# Patient Record
Sex: Female | Born: 1985
Health system: Southern US, Community
[De-identification: ages and names within clinical notes are randomized; demographics above are authoritative.]

## PROBLEM LIST (undated history)

## (undated) ENCOUNTER — Inpatient Hospital Stay: Payer: Self-pay

## (undated) DIAGNOSIS — K859 Acute pancreatitis without necrosis or infection, unspecified: Secondary | ICD-10-CM

## (undated) DIAGNOSIS — K429 Umbilical hernia without obstruction or gangrene: Secondary | ICD-10-CM

## (undated) DIAGNOSIS — M6208 Separation of muscle (nontraumatic), other site: Secondary | ICD-10-CM

## (undated) DIAGNOSIS — K824 Cholesterolosis of gallbladder: Secondary | ICD-10-CM

## (undated) HISTORY — DX: Umbilical hernia without obstruction or gangrene: K42.9

## (undated) HISTORY — DX: Cholesterolosis of gallbladder: K82.4

## (undated) HISTORY — DX: Acute pancreatitis without necrosis or infection, unspecified: K85.90

## (undated) HISTORY — DX: Separation of muscle (nontraumatic), other site: M62.08

## (undated) HISTORY — PX: WISDOM TOOTH EXTRACTION: SHX21

---

## 2005-12-31 ENCOUNTER — Ambulatory Visit: Payer: Self-pay | Admitting: Family Medicine

## 2006-01-08 ENCOUNTER — Ambulatory Visit: Payer: Self-pay | Admitting: Family Medicine

## 2007-06-17 IMAGING — CT CT ABDOMEN WO/W CM
2 of 4 series · 14 of 32 positions shown, 19 images · non-contrast
Comparison: none

REASON FOR EXAM: Abnormal ultrasound exam showing abnormality of left
kidney
COMMENTS:

PROCEDURE:     CT  - CT ABDOMEN STANDARD W/WO  - January 08, 2006  [DATE]
RESULT:     Triphasic CT scan of abdomen is performed with and without
contrast. [employer] 3D reconstructions are performed by the physician at the
Workstation.
Correlation is made to previous ultrasound dated 12/31/2005.

[Series 4: soft tissue with · axial · 0.57mm/px · z∈[-258,-98]mm · 7 of 28 slices shown, 12 images]
[im 4/28  soft-tissue]
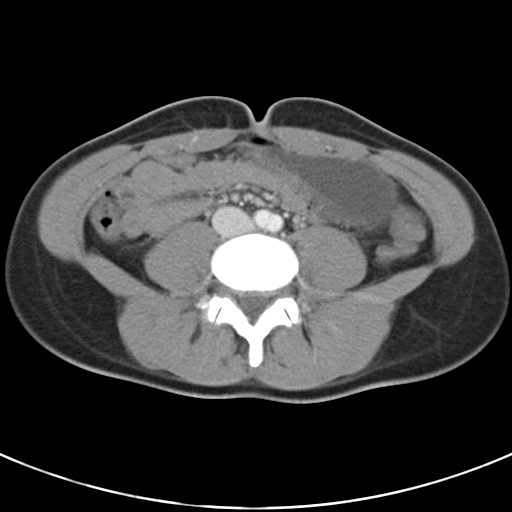
[im 4/28  bone]
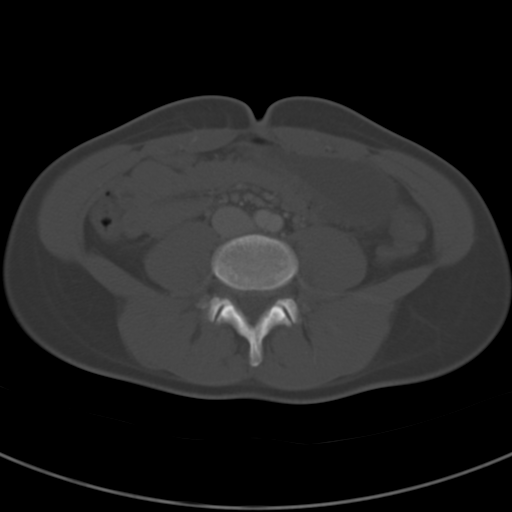
[im 7/28  soft-tissue]
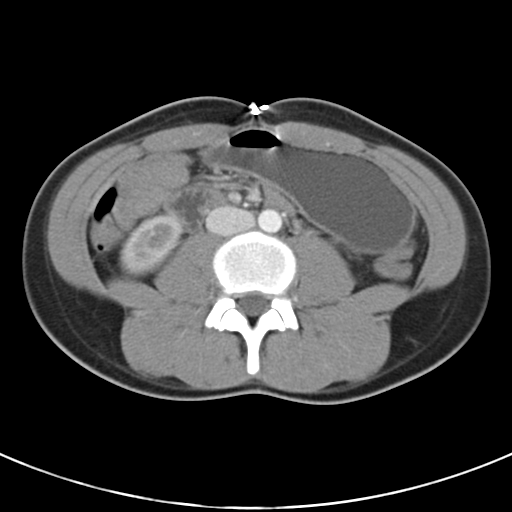
[im 11/28  soft-tissue]
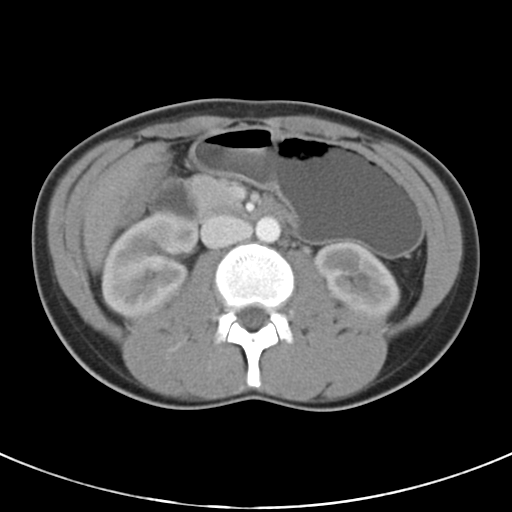
[im 14/28  soft-tissue]
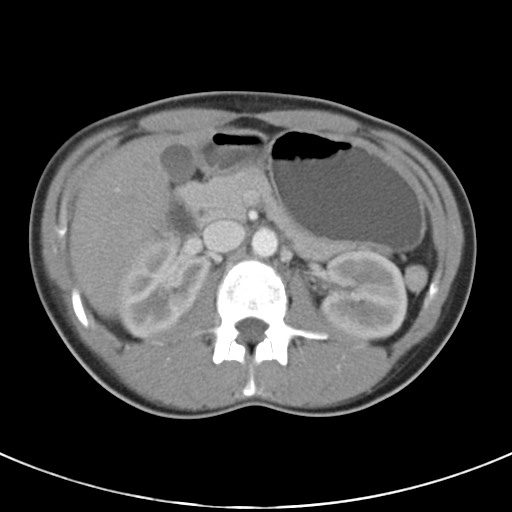
[im 14/28  lung]
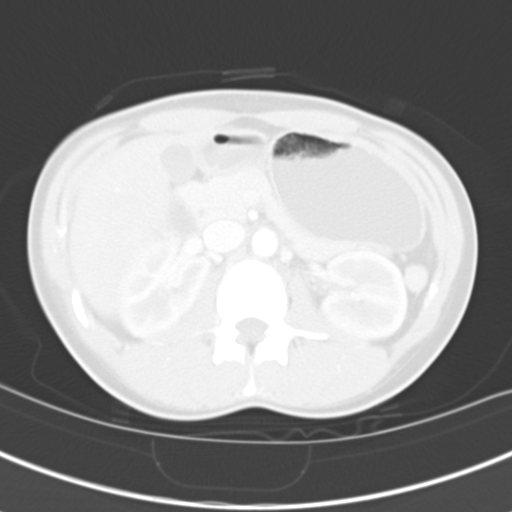
[im 17/28  soft-tissue]
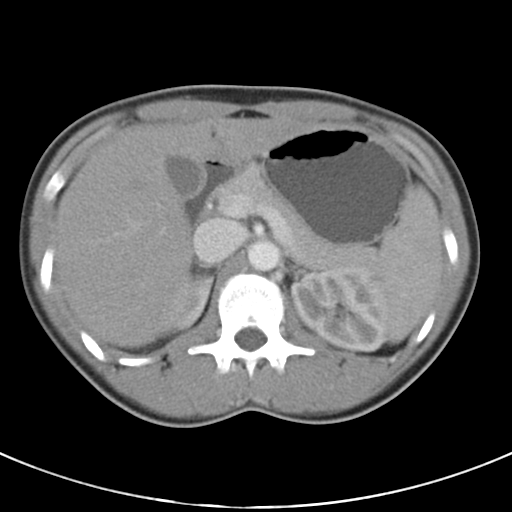
[im 17/28  lung]
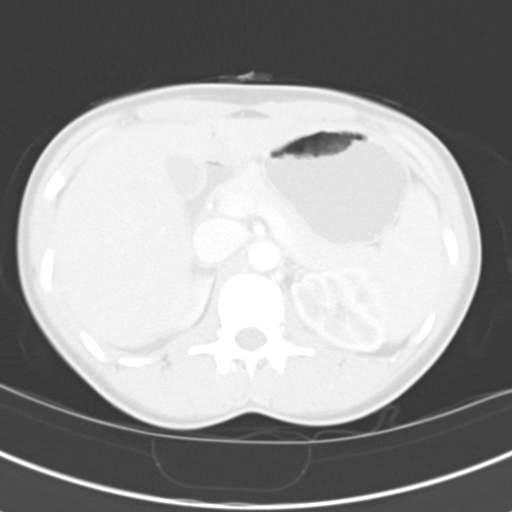
[im 21/28  soft-tissue]
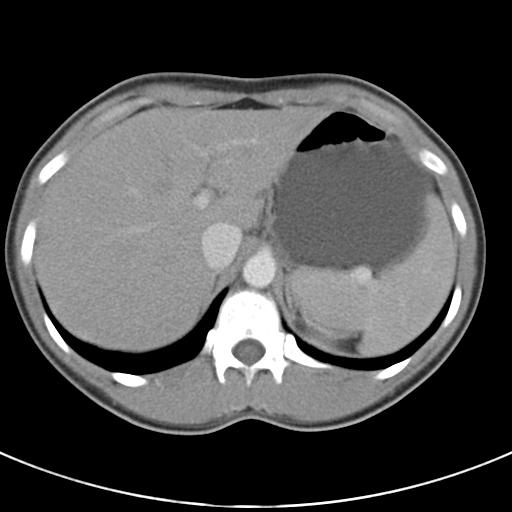
[im 21/28  lung]
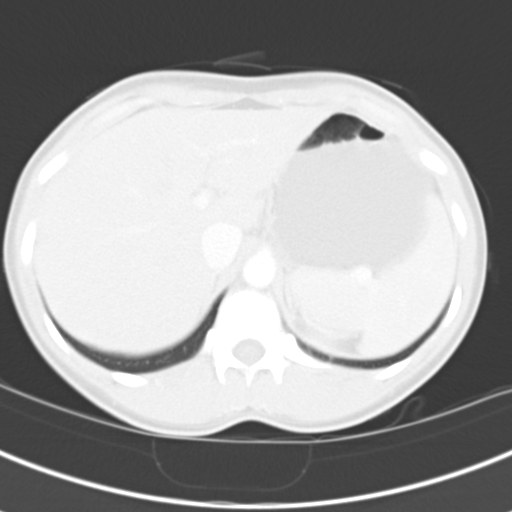
[im 24/28  soft-tissue]
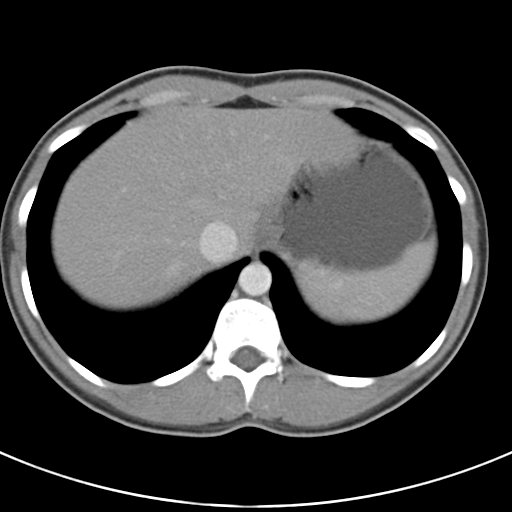
[im 24/28  lung]
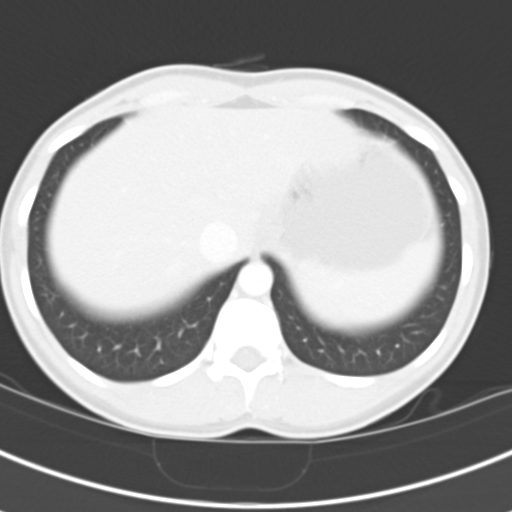

[Series 6: soft tissue delay · axial · delayed · 0.57mm/px · z∈[-258,-98]mm · 7 of 28 slices shown]
[im 4/28  soft-tissue]
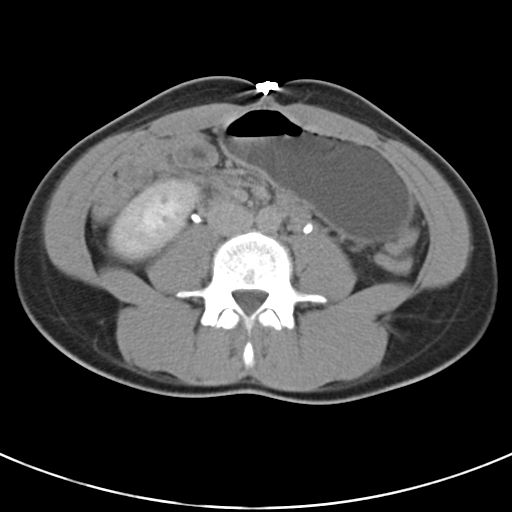
[im 7/28  soft-tissue]
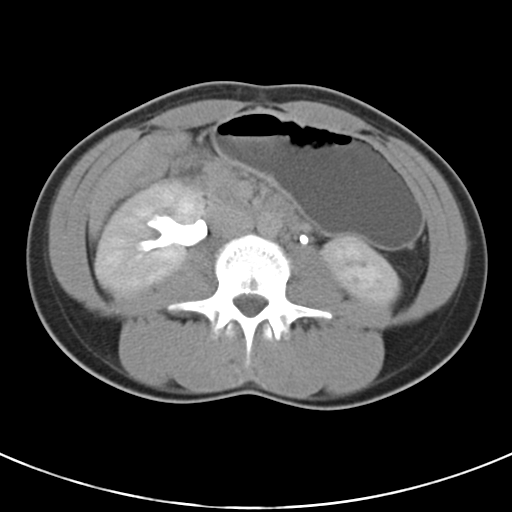
[im 11/28  soft-tissue]
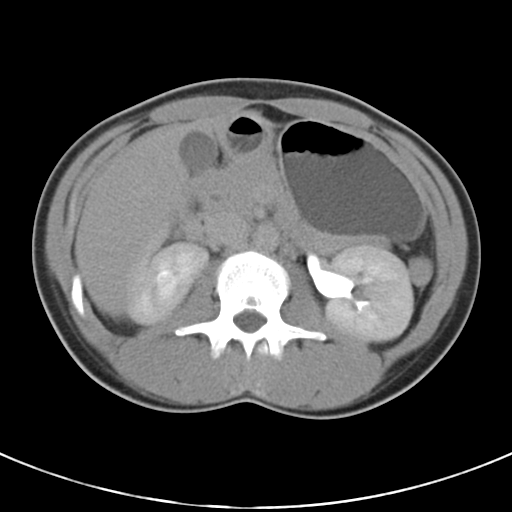
[im 14/28  soft-tissue]
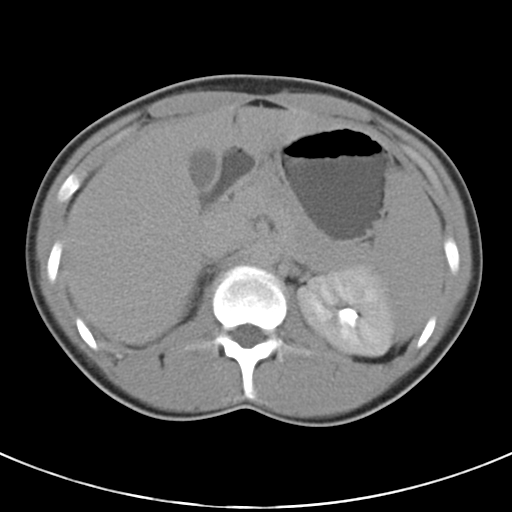
[im 17/28  soft-tissue]
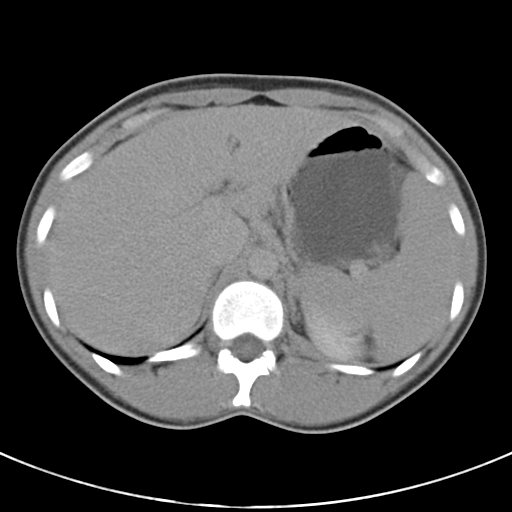
[im 21/28  soft-tissue]
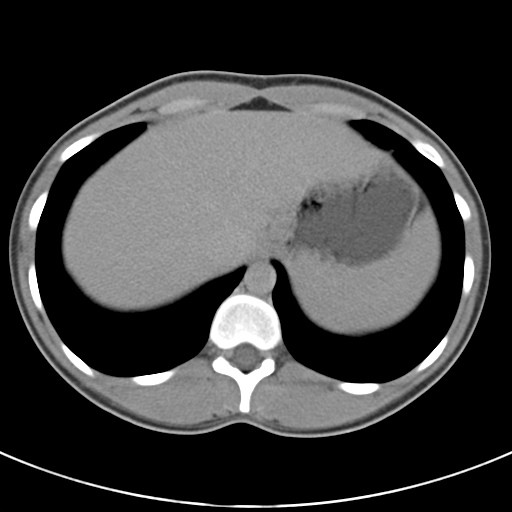
[im 24/28  soft-tissue]
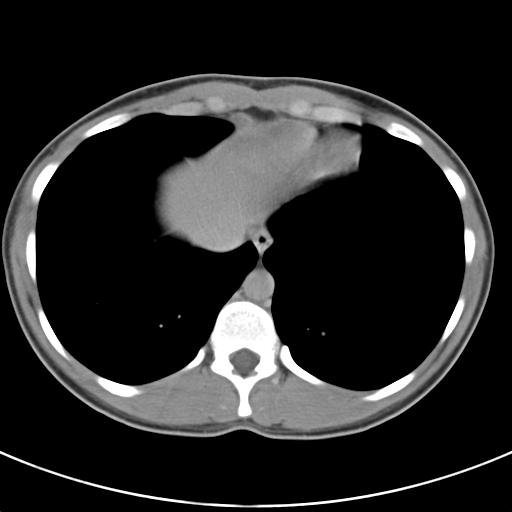

[14 of 32 positions shown; findings below may reference images not displayed]

FINDINGS: The study demonstrates a small, splenule extending inferiorly from
the spleen and abutting the LEFT lateral margin of the mid portion of the
LEFT kidney. This does not appear to represent an exophytic renal mass. The
kidneys appear to enhance normally. There is no obstructive change. Both
kidneys excrete contrast on the delayed images. The liver, pancreas, spleen,
aorta and kidneys are otherwise normal. No bowel contrast was given but no
gross bowel abnormality is seen. There is water in the stomach.
IMPRESSION: No evidence of a LEFT renal mass. The appearance is
secondary to a small, splenule extending inferiorly from the spleen.

## 2007-11-26 ENCOUNTER — Emergency Department: Payer: Self-pay

## 2008-07-04 ENCOUNTER — Observation Stay: Payer: Self-pay

## 2008-07-05 ENCOUNTER — Observation Stay: Payer: Self-pay | Admitting: Unknown Physician Specialty

## 2008-07-06 ENCOUNTER — Inpatient Hospital Stay: Payer: Self-pay

## 2009-01-14 LAB — HM PAP SMEAR

## 2009-05-04 IMAGING — US US OB < 14 WEEKS
1 series · 17 of 28 positions shown · non-contrast
Comparison: none

REASON FOR EXAM: bleeding 1st trimester pregnancy
COMMENTS:

[Series 1: us ob < 14 weeks · 17 of 28 slices shown]
[im 1/28]
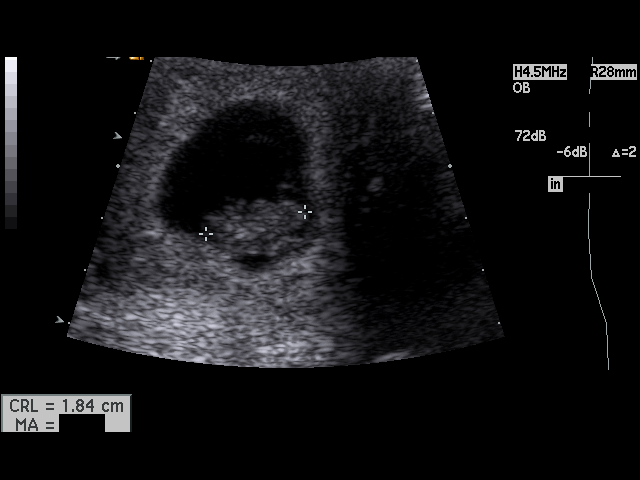
[im 3/28]
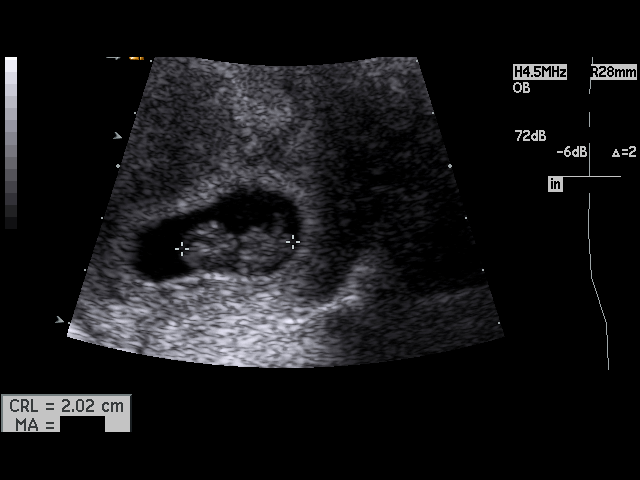
[im 5/28]
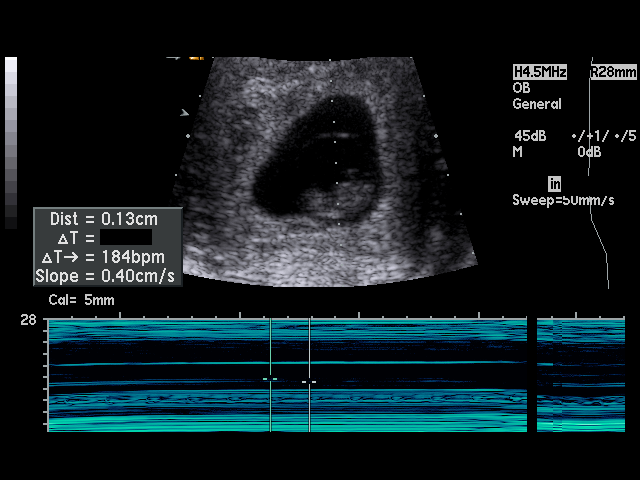
[im 6/28]
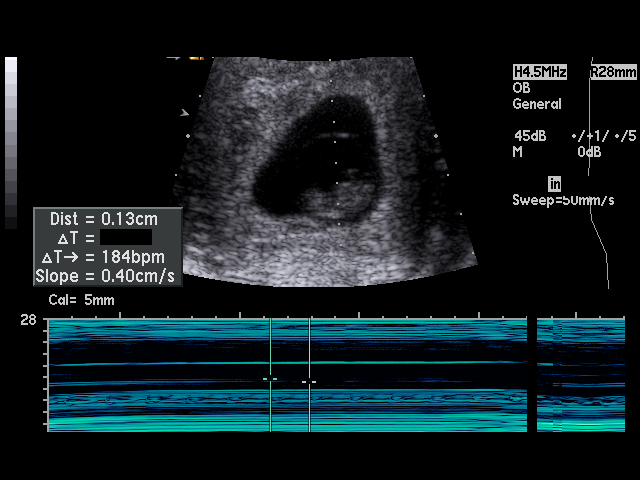
[im 8/28]
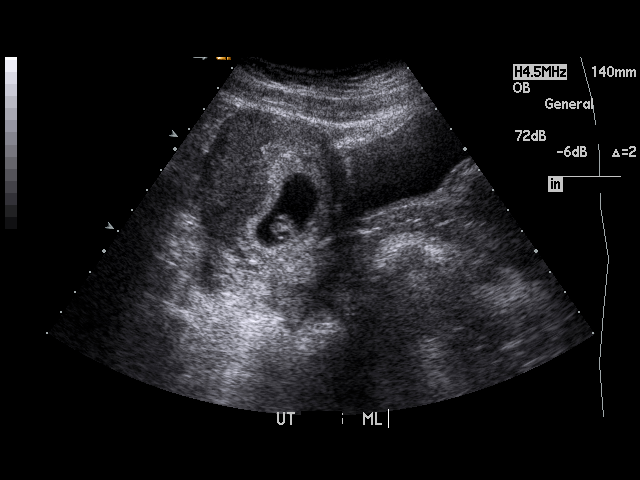
[im 10/28]
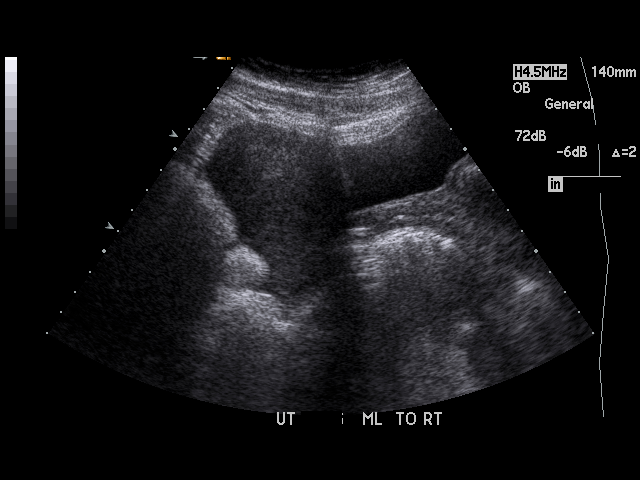
[im 11/28]
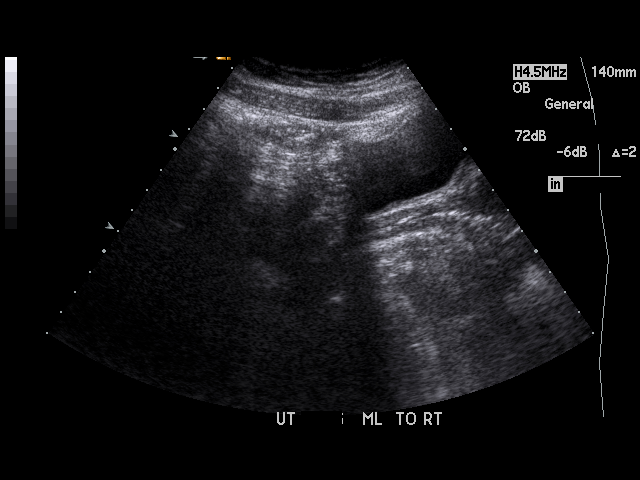
[im 13/28]
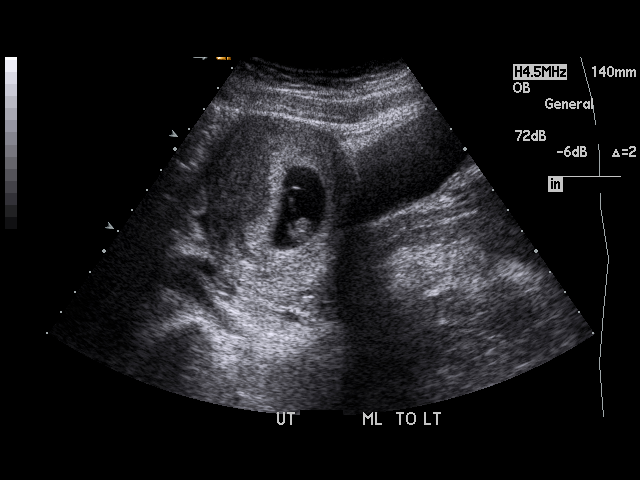
[im 15/28]
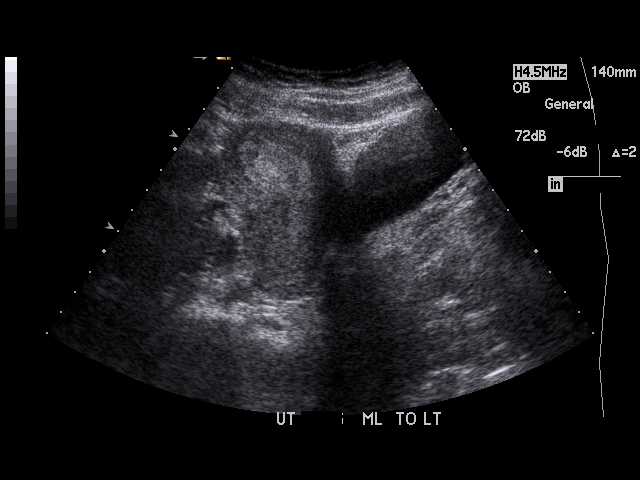
[im 16/28]
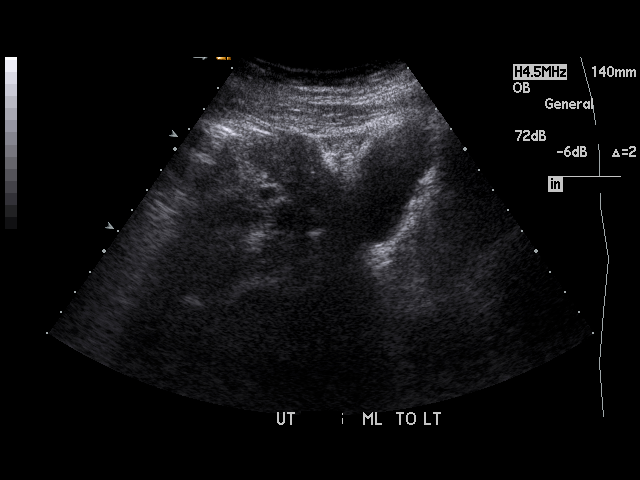
[im 18/28]
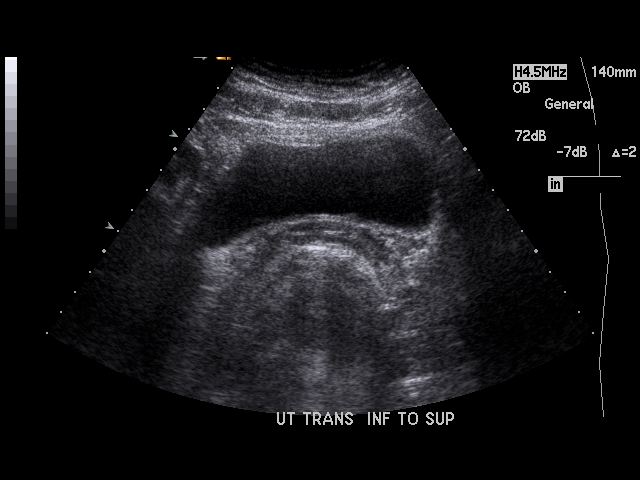
[im 19/28]
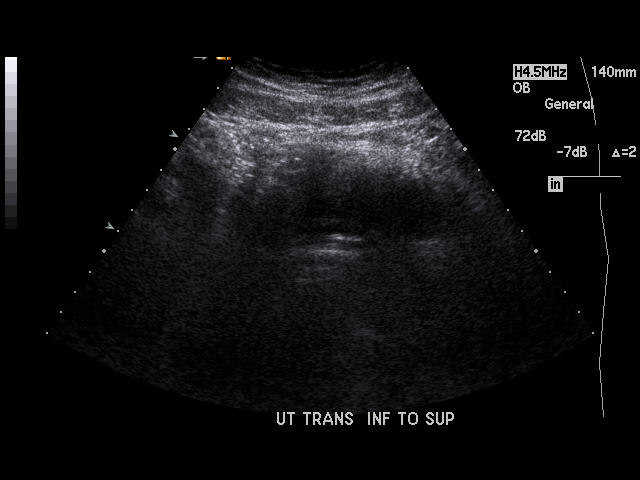
[im 21/28]
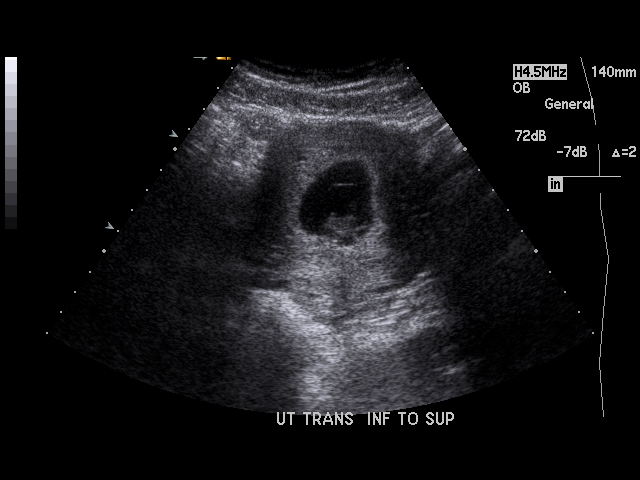
[im 23/28]
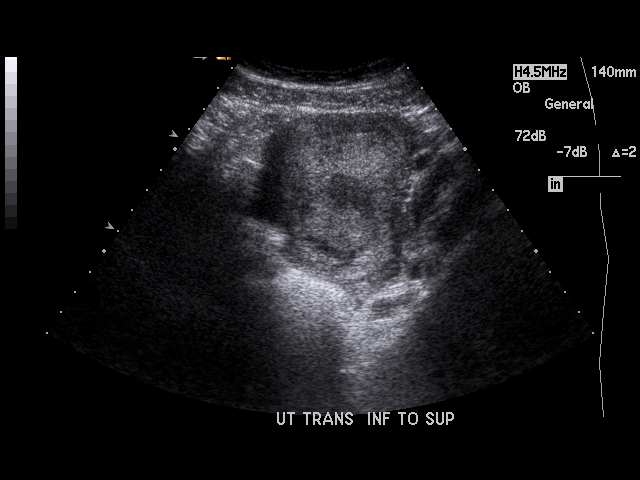
[im 24/28]
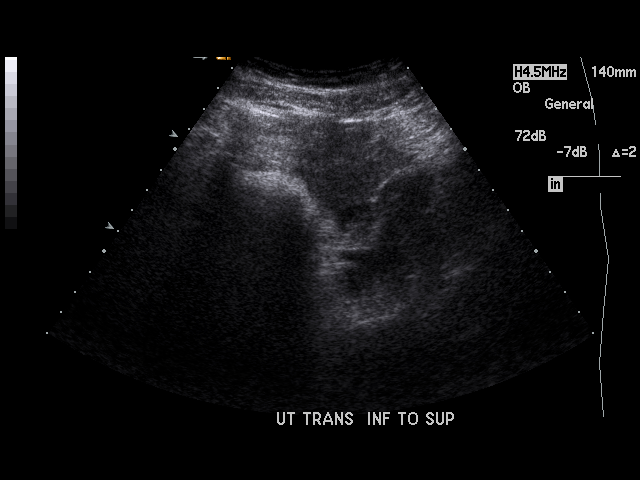
[im 26/28]
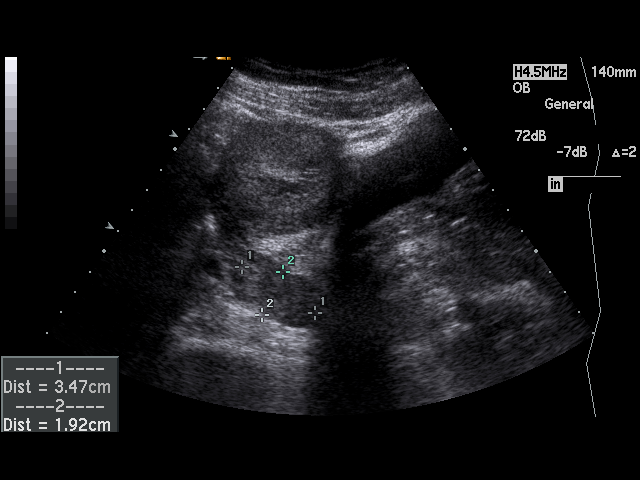
[im 28/28]
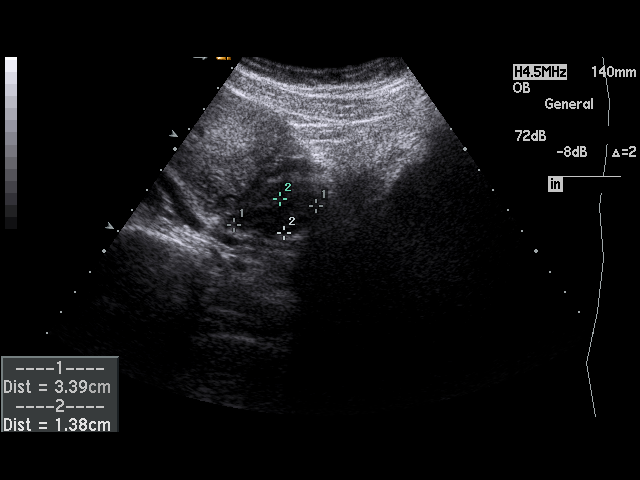

[17 of 28 positions shown; findings below may reference images not displayed]

PROCEDURE:     US  - US OB LESS THAN 14 WEEKS  - November 26, 2007 [DATE]

RESULT:     There is observed a single living intrauterine gestation. Embryo
heart rate was monitored at 184 beats per minute. The yolk sac is
visualized. Crown-rump length measures 1.95 cm which corresponds to an
estimated gestational age of 8 weeks-9 days. Ultrasound EDD is July 03, 2008.

The maternal ovaries are visualized bilaterally. The RIGHT ovary measures
3.47 cm at maximum diameter and the LEFT ovary measures 3.39 cm at maximum
diameter. No abnormal adnexal masses are seen. There is no free fluid in the
pelvis. The visualized portion of the maternal urinary bladder is normal in
appearance.
IMPRESSION: 1.     Living intrauterine gestation of approximately 8 weeks-9 days
gestational age.

## 2009-08-12 ENCOUNTER — Emergency Department: Payer: Self-pay | Admitting: Emergency Medicine

## 2011-01-11 ENCOUNTER — Inpatient Hospital Stay: Payer: Self-pay

## 2013-01-14 ENCOUNTER — Ambulatory Visit (INDEPENDENT_AMBULATORY_CARE_PROVIDER_SITE_OTHER): Payer: BC Managed Care – PPO | Admitting: Internal Medicine

## 2013-01-14 ENCOUNTER — Encounter: Payer: Self-pay | Admitting: Internal Medicine

## 2013-01-14 VITALS — BP 108/80 | HR 65 | Temp 98.4°F | Ht 63.0 in | Wt 118.0 lb

## 2013-01-14 DIAGNOSIS — G4700 Insomnia, unspecified: Secondary | ICD-10-CM | POA: Insufficient documentation

## 2013-01-14 DIAGNOSIS — K439 Ventral hernia without obstruction or gangrene: Secondary | ICD-10-CM

## 2013-01-14 MED ORDER — ALPRAZOLAM 0.25 MG PO TABS: 0.2500 mg | ORAL_TABLET | Freq: Every evening | ORAL | Status: DC | PRN

## 2013-01-14 NOTE — Assessment & Plan Note (Signed)
Large ventral wall hernia after 2 pregnancies. Pt is interested in having hernia repaired and excess skin removed. However, she is also considering getting pregnant again in the next few years. Will set up general surgery consultation. Question whether she would be better off waiting on hernia repair given risk of recurrence with subsequent pregnancy.

## 2013-01-14 NOTE — Assessment & Plan Note (Signed)
Recent issues with early waking, sometimes associated with anxiety, sometimes without clear cause. Will start prn alprazolam to help with sleep and anxiety. Pt will call if symptoms are not improving.

## 2013-01-14 NOTE — Progress Notes (Signed)
Pre-visit discussion using our clinic review tool. No additional management support is needed unless otherwise documented below in the visit note.  

## 2013-01-14 NOTE — Progress Notes (Signed)
Subjective:    Patient ID: Renee Fleming, female    DOB: 12-28-1985, 27 y.o.   MRN: [id number]  HPI 27YO female presents to establish care. She has been generally healthy with no chronic medical issues. She has two children, aged 4 and 2, who were both >8lb babies, delivered vaginally. After her pregnancies she has noted an open area in her abdominal wall, through which she can see and palpate her bowel. She is interested in having this area repaired and having excess skin removed.  She is also concerned today about several week h/o early waking, typically around 4am. She has had some anxiety in the past with her pregnancies and post-partum, however recently symptoms have been well controlled. She is following healthy diet, exercising, taking time for herself. She denies depression or significant anxiety at present. She is not currently taking any medication for sleep.  Outpatient Encounter Prescriptions as of 01/14/2013  Medication Sig  . Multiple Vitamin (MULTIVITAMIN) tablet Take 1 tablet by mouth daily.   BP 108/80  Pulse 65  Temp(Src) 98.4 F (36.9 C) (Oral)  Ht 5\' 3"  (1.6 m)  Wt 118 lb (53.524 kg)  BMI 20.91 kg/m2  SpO2 99%  LMP 01/07/2013  Review of Systems  Constitutional: Negative for fever, chills, appetite change, fatigue and unexpected weight change.  HENT: Negative for congestion, ear pain, sinus pressure, sore throat, trouble swallowing and voice change.   Eyes: Negative for visual disturbance.  Respiratory: Negative for cough, shortness of breath, wheezing and stridor.   Cardiovascular: Negative for chest pain, palpitations and leg swelling.  Gastrointestinal: Positive for abdominal distention. Negative for nausea, vomiting, abdominal pain, diarrhea, constipation, blood in stool and anal bleeding.  Genitourinary: Negative for dysuria and flank pain.  Musculoskeletal: Negative for arthralgias, gait problem, myalgias and neck pain.  Skin: Negative for color change and  rash.  Neurological: Negative for dizziness and headaches.  Hematological: Negative for adenopathy. Does not bruise/bleed easily.  Psychiatric/Behavioral: Positive for sleep disturbance. Negative for suicidal ideas and dysphoric mood. The patient is not nervous/anxious.        Objective:   Physical Exam  Constitutional: She is oriented to person, place, and time. She appears well-developed and well-nourished. No distress.  HENT:  Head: Normocephalic and atraumatic.  Right Ear: External ear normal.  Left Ear: External ear normal.  Nose: Nose normal.  Mouth/Throat: Oropharynx is clear and moist. No oropharyngeal exudate.  Eyes: Conjunctivae are normal. Pupils are equal, round, and reactive to light. Right eye exhibits no discharge. Left eye exhibits no discharge. No scleral icterus.  Neck: Normal range of motion. Neck supple. No tracheal deviation present. No thyromegaly present.  Cardiovascular: Normal rate, regular rhythm, normal heart sounds and intact distal pulses.  Exam reveals no gallop and no friction rub.   No murmur heard. Pulmonary/Chest: Effort normal and breath sounds normal. No accessory muscle usage. Not tachypneic. No respiratory distress. She has no decreased breath sounds. She has no wheezes. She has no rhonchi. She has no rales. She exhibits no tenderness.  Abdominal: Soft. Bowel sounds are normal. She exhibits no distension. There is no tenderness. A hernia is present. Hernia confirmed positive in the ventral area.    Musculoskeletal: Normal range of motion. She exhibits no edema and no tenderness.  Lymphadenopathy:    She has no cervical adenopathy.  Neurological: She is alert and oriented to person, place, and time. No cranial nerve deficit. She exhibits normal muscle tone. Coordination normal.  Skin: Skin is warm and  dry. No rash noted. She is not diaphoretic. No erythema. No pallor.  Psychiatric: She has a normal mood and affect. Her behavior is normal. Judgment and  thought content normal.          Assessment & Plan:

## 2013-01-17 ENCOUNTER — Encounter: Payer: Self-pay | Admitting: General Surgery

## 2013-02-07 ENCOUNTER — Encounter: Payer: Self-pay | Admitting: General Surgery

## 2013-02-07 ENCOUNTER — Ambulatory Visit (INDEPENDENT_AMBULATORY_CARE_PROVIDER_SITE_OTHER): Payer: BC Managed Care – PPO | Admitting: General Surgery

## 2013-02-07 VITALS — BP 110/70 | HR 76 | Resp 12 | Ht 62.0 in | Wt 120.0 lb

## 2013-02-07 DIAGNOSIS — K439 Ventral hernia without obstruction or gangrene: Secondary | ICD-10-CM

## 2013-02-07 NOTE — Patient Instructions (Addendum)
Patient to be scheduled for abdominal/pelvic CT Scan. Patient to follow up after CT Scan is done.

## 2013-02-07 NOTE — Progress Notes (Signed)
Patient ID: Renee Fleming, female   DOB: 1986-03-10, 27 y.o.   MRN: [id number]  Chief Complaint  Patient presents with  . Other    umbilical hernia    HPI Renee Fleming is a 27 y.o. female who presents for an evaluation of an umbilical hernia. The patient states she has always had an umbilical hernia but has gotten worse after her two pregnancies. She states that this past weekend she had some pain in the area when lifting her child and when her husband picked her up to hug her.  She then experienced some discomfort when she went to pick up her 79 pound 63-year-old child.  The patient is accompanied today by her husband was present for the interview and exam.  HPI  Past Medical History  Diagnosis Date  . IUD (intrauterine device) in place     Past Surgical History  Procedure Laterality Date  . Wisdom tooth extraction    . Vaginal delivery      2x no complications    Family History  Problem Relation Age of Onset  . Cancer Paternal Grandmother     kidney    Social History History  Substance Use Topics  . Smoking status: Former Smoker    Quit date: 01/15/2008  . Smokeless tobacco: Not on file  . Alcohol Use: 1.0 oz/week    2 drink(s) per week    Allergies  Allergen Reactions  . Neosporin [Neomycin-Bacitracin Zn-Polymyx] Rash    Current Outpatient Prescriptions  Medication Sig Dispense Refill  . Multiple Vitamin (MULTIVITAMIN) tablet Take 1 tablet by mouth daily.       No current facility-administered medications for this visit.    Review of Systems Review of Systems  Constitutional: Negative.   Respiratory: Negative.   Cardiovascular: Negative.   Gastrointestinal: Negative.     Blood pressure 110/70, pulse 76, resp. rate 12, height 5\' 2"  (1.575 m), weight 120 lb (54.432 kg), last menstrual period 01/07/2013.  Physical Exam Physical Exam  Constitutional: She is oriented to person, place, and time. She appears well-developed and well-nourished.  Neck:  Normal range of motion. No thyromegaly present.  Cardiovascular: Normal rate and regular rhythm.   Pulmonary/Chest: Effort normal and breath sounds normal.  Abdominal: Soft. Bowel sounds are normal. There is no tenderness.    Weakness around the umbilicus.   Lymphadenopathy:    She has no cervical adenopathy.  Neurological: She is alert and oriented to person, place, and time.  Skin: Skin is warm and dry.  Psychiatric: She has a normal mood and affect. Her speech is normal.    Data Reviewed PCP notes.  Assessment    Ventral hernia versus diastasis recti.     Plan    We'll obtain a CT to better assess the fascia. The patient and her husband are absent having additional children, and if a hernia repair is required this will need to be taken into consideration.     Patient to be scheduled for a CT abdomen/pelvis with IV and oral contrast. This patient wishes to check work schedule first and call the office back to arrange. She reports her last menstrual period was 02-04-13.     Earline Mayotte 02/07/2013, 9:14 PM

## 2013-02-10 ENCOUNTER — Encounter: Payer: Self-pay | Admitting: *Deleted

## 2013-02-10 ENCOUNTER — Telehealth: Payer: Self-pay | Admitting: *Deleted

## 2013-02-10 NOTE — Telephone Encounter (Signed)
Patient has been scheduled for a CT abdomen/pelvis with IV and oral contrast at [facility] for 03-16-13 at 1:00 pm (arrive 12:45 pm). Prep: no solids 4 hours prior but patient may have clear liquids up until exam time, pick up prep kit, and take medication list. Patient verbalizes understanding.   Please note: patient requested to wait till January due to her schedule.   Patient has been scheduled for an office visit follow up post CT scan.

## 2013-02-15 ENCOUNTER — Telehealth: Payer: Self-pay | Admitting: *Deleted

## 2013-02-15 NOTE — Telephone Encounter (Signed)
Spoke with patient informed her should would need to be seen. Patient declined scheduling an appointment right now, she will see how it goes over the next couple days if no better then she would call to schedule an appointment.

## 2013-02-15 NOTE — Telephone Encounter (Signed)
Patient left a message on the voicemail stating she had a stomach bug on Thursday and can not seem to get better. Returned patient call, she stated on Thursday she had some kind of GI bug with N&V, fever and diarrhea. Well on Friday and Saturday she did still have a little of the diarrhea but felt better with no other symptoms. Sunday she began vomiting again and watery stool. Today she is feeling really nauseous and having the diarrhea again. Has not traveled anywhere recently or taken anything for this. Please advise.

## 2013-02-15 NOTE — Telephone Encounter (Signed)
Needs to be seen in a visit. 

## 2013-03-12 LAB — HM PAP SMEAR

## 2013-03-14 ENCOUNTER — Telehealth: Payer: Self-pay | Admitting: *Deleted

## 2013-03-14 NOTE — Telephone Encounter (Signed)
Patient called to report that her last menstrual period was 03-03-13.   Britta MccreedyBarbara in the scheduling department notified accordingly.   We will proceed with CT scan that is [facility]16[facility].

## 2013-03-21 ENCOUNTER — Telehealth: Payer: Self-pay

## 2013-03-21 NOTE — Telephone Encounter (Signed)
Patient called to let us know that she had decided to not go thru with her hernia repair surgery until after she has another child. She has cancelled her CT scan at [facility]. She says that she has discussed this with her OB/GYN. She will get back in tough with us after she delivers to see about having her hernia repaired.

## 2013-03-22 ENCOUNTER — Ambulatory Visit: Payer: BC Managed Care – PPO | Admitting: General Surgery

## 2013-12-12 ENCOUNTER — Ambulatory Visit: Payer: Self-pay | Admitting: Obstetrics and Gynecology

## 2014-01-24 ENCOUNTER — Encounter: Payer: BC Managed Care – PPO | Admitting: Internal Medicine

## 2014-02-09 ENCOUNTER — Ambulatory Visit (INDEPENDENT_AMBULATORY_CARE_PROVIDER_SITE_OTHER): Payer: BC Managed Care – PPO | Admitting: Internal Medicine

## 2014-02-09 ENCOUNTER — Encounter: Payer: Self-pay | Admitting: Internal Medicine

## 2014-02-09 ENCOUNTER — Encounter (INDEPENDENT_AMBULATORY_CARE_PROVIDER_SITE_OTHER): Payer: Self-pay

## 2014-02-09 VITALS — BP 115/76 | HR 66 | Temp 98.4°F | Ht 63.2 in | Wt 122.5 lb

## 2014-02-09 DIAGNOSIS — F4323 Adjustment disorder with mixed anxiety and depressed mood: Secondary | ICD-10-CM

## 2014-02-09 DIAGNOSIS — N920 Excessive and frequent menstruation with regular cycle: Secondary | ICD-10-CM

## 2014-02-09 DIAGNOSIS — Z0000 Encounter for general adult medical examination without abnormal findings: Secondary | ICD-10-CM

## 2014-02-09 LAB — COMPREHENSIVE METABOLIC PANEL
ALT: 12 U/L (ref 0–35)
AST: 21 U/L (ref 0–37)
Albumin: 5 g/dL (ref 3.5–5.2)
Alkaline Phosphatase: 42 U/L (ref 39–117)
BUN: 17 mg/dL (ref 6–23)
CALCIUM: 9.6 mg/dL (ref 8.4–10.5)
CHLORIDE: 113 meq/L — AB (ref 96–112)
CO2: 22 meq/L (ref 19–32)
CREATININE: 0.8 mg/dL (ref 0.4–1.2)
GFR: 96.24 mL/min (ref >60.00)
Glucose, Bld: 107 mg/dL — ABNORMAL HIGH (ref 70–99)
Potassium: 4.6 mEq/L (ref 3.5–5.1)
Sodium: 145 mEq/L (ref 135–145)
Total Bilirubin: 0.9 mg/dL (ref 0.2–1.2)
Total Protein: 7.6 g/dL (ref 6.0–8.3)

## 2014-02-09 LAB — CBC WITH DIFFERENTIAL/PLATELET
BASOS PCT: 0.3 % (ref 0.0–3.0)
Basophils Absolute: 0 10*3/uL (ref 0.0–0.1)
Eosinophils Absolute: 0.1 10*3/uL (ref 0.0–0.7)
Eosinophils Relative: 1.4 % (ref 0.0–5.0)
HEMATOCRIT: 38.8 % (ref 36.0–46.0)
Hemoglobin: 13.1 g/dL (ref 12.0–15.0)
LYMPHS PCT: 33.6 % (ref 12.0–46.0)
Lymphs Abs: 1.8 10*3/uL (ref 0.7–4.0)
MCHC: 33.8 g/dL (ref 30.0–36.0)
MCV: 89.2 fl (ref 78.0–100.0)
Monocytes Absolute: 0.4 10*3/uL (ref 0.1–1.0)
Monocytes Relative: 6.9 % (ref 3.0–12.0)
NEUTROS ABS: 3.1 10*3/uL (ref 1.4–7.7)
Neutrophils Relative %: 57.8 % (ref 43.0–77.0)
Platelets: 200 10*3/uL (ref 150.0–400.0)
RBC: 4.35 Mil/uL (ref 3.87–5.11)
RDW: 12.1 % (ref 11.5–15.5)
WBC: 5.3 10*3/uL (ref 4.0–10.5)

## 2014-02-09 LAB — LUTEINIZING HORMONE: LH: 2.96 m[IU]/mL

## 2014-02-09 LAB — FERRITIN: Ferritin: 27.4 ng/mL (ref 10.0–291.0)

## 2014-02-09 LAB — FOLLICLE STIMULATING HORMONE: FSH: 5.2 m[IU]/mL

## 2014-02-09 LAB — T4, FREE: FREE T4: 0.8 ng/dL (ref 0.60–1.60)

## 2014-02-09 LAB — TSH: TSH: 1.75 u[IU]/mL (ref 0.35–4.50)

## 2014-02-09 LAB — VITAMIN D 25 HYDROXY (VIT D DEFICIENCY, FRACTURES): VITD: 34.97 ng/mL (ref 30.00–100.00)

## 2014-02-09 LAB — HCG, QUANTITATIVE, PREGNANCY: Quantitative HCG: 0.34 m[IU]/mL

## 2014-02-09 NOTE — Progress Notes (Signed)
Subjective:    Patient ID: Renee Fleming, female    DOB: 1985-10-31, 28 y.o.   MRN: [id number]  HPI 28YO female presents for annual exam. Difficult time for her recently. Miscarriage in Oct 2015 at 7 weeks. Menses on 11/11 which was very heavy with frequent clotting.Marland Kitchen. Has been charting ovulation and has not ovulated. Would like to get pregnant. Breasts have been tender. Feels hot occasionally. No vaginal dryness, but notes decreased libido. Anxiety has worsened after miscarriage and with recent death of her dog and divorce of her parents. Scheduled to see a counselor. Had pelvic exam with OB which was normal. Taking PNV and extra Vit C and probiotic   Review of Systems  Constitutional: Positive for fatigue. Negative for fever, chills, appetite change and unexpected weight change.  Eyes: Negative for visual disturbance.  Respiratory: Negative for shortness of breath.   Cardiovascular: Negative for chest pain and leg swelling.  Gastrointestinal: Negative for abdominal pain.  Genitourinary: Positive for vaginal bleeding and menstrual problem. Negative for vaginal discharge, genital sores and vaginal pain.  Musculoskeletal: Negative for myalgias and arthralgias.  Skin: Negative for color change and rash.  Hematological: Negative for adenopathy. Does not bruise/bleed easily.  Psychiatric/Behavioral: Positive for sleep disturbance and dysphoric mood. The patient is nervous/anxious.        Objective:    BP 115/76 mmHg  Pulse 66  Temp(Src) 98.4 F (36.9 C) (Oral)  Ht 5' 3.2" (1.605 m)  Wt 122 lb 8 oz (55.566 kg)  BMI 21.57 kg/m2  SpO2 100%  LMP 01/18/2014 Physical Exam  Constitutional: She is oriented to person, place, and time. She appears well-developed and well-nourished. No distress.  HENT:  Head: Normocephalic and atraumatic.  Right Ear: External ear normal.  Left Ear: External ear normal.  Nose: Nose normal.  Mouth/Throat: Oropharynx is clear and moist. No oropharyngeal  exudate.  Eyes: Conjunctivae and EOM are normal. Pupils are equal, round, and reactive to light. Right eye exhibits no discharge.  Neck: Normal range of motion. Neck supple. No thyromegaly present.  Cardiovascular: Normal rate, regular rhythm, normal heart sounds and intact distal pulses.  Exam reveals no gallop and no friction rub.   No murmur heard. Pulmonary/Chest: Effort normal. No respiratory distress. She has no wheezes. She has no rales.  Abdominal: Soft. Bowel sounds are normal. She exhibits no distension and no mass. There is no tenderness. There is no rebound and no guarding.  Musculoskeletal: Normal range of motion. She exhibits no edema or tenderness.  Lymphadenopathy:    She has no cervical adenopathy.  Neurological: She is alert and oriented to person, place, and time. No cranial nerve deficit. Coordination normal.  Skin: Skin is warm and dry. No rash noted. She is not diaphoretic. No erythema. No pallor.  Psychiatric: Her speech is normal and behavior is normal. Judgment and thought content normal. Her mood appears anxious. Cognition and memory are normal. She exhibits a depressed mood. She expresses no suicidal ideation. She expresses no suicidal plans.          Assessment & Plan:   Problem List Items Addressed This Visit      Unprioritized   Adjustment disorder with mixed anxiety and depressed mood    Several difficult events for her recently with miscarriage, divorce of her parents and death of her dog. Offered support. Encouraged her to follow through on counseling. We discussed adding medication, but will hold off for now as she is actively trying to get pregnant and is  concerned about potential side effects of the medications.    Menorrhagia with regular cycle    Recent menorrhagia after miscarriage. She reports OB evaluation was normal. Will check CBC with labs today. Will also check thyroid function.    Relevant Orders      T4, free      TSH      CBC with  Differential      FSH      LH      Ferritin      hCG, quantitative, pregnancy   Routine general medical examination at a health care facility - Primary    General medical exam normal today. Breast and pelvic exam deferred as recently completed by OB. Immunizations are UTD. Will check labs including CBC, CMP, lipids, TSH. Encouraged healthy diet and exercise.    Relevant Orders      CBC with Differential      Comprehensive metabolic panel      Vit D  25 hydroxy (rtn osteoporosis monitoring)      EKG 12-Lead (Completed)       Return in about 4 weeks (around 03/09/2014).

## 2014-02-09 NOTE — Assessment & Plan Note (Signed)
General medical exam normal today. Breast and pelvic exam deferred as recently completed by OB. Immunizations are UTD. Will check labs including CBC, CMP, lipids, TSH. Encouraged healthy diet and exercise.

## 2014-02-09 NOTE — Patient Instructions (Signed)

## 2014-02-09 NOTE — Assessment & Plan Note (Signed)
Several difficult events for her recently with miscarriage, divorce of her parents and death of her dog. Offered support. Encouraged her to follow through on counseling. We discussed adding medication, but will hold off for now as she is actively trying to get pregnant and is concerned about potential side effects of the medications.

## 2014-02-09 NOTE — Assessment & Plan Note (Signed)
Recent menorrhagia after miscarriage. She reports OB evaluation was normal. Will check CBC with labs today. Will also check thyroid function.

## 2014-02-09 NOTE — Progress Notes (Signed)
Pre visit review using our clinic review tool, if applicable. No additional management support is needed unless otherwise documented below in the visit note. 

## 2014-02-11 ENCOUNTER — Encounter: Payer: Self-pay | Admitting: Internal Medicine

## 2014-02-13 ENCOUNTER — Other Ambulatory Visit (INDEPENDENT_AMBULATORY_CARE_PROVIDER_SITE_OTHER): Payer: BC Managed Care – PPO

## 2014-02-13 DIAGNOSIS — N920 Excessive and frequent menstruation with regular cycle: Secondary | ICD-10-CM

## 2014-02-13 LAB — HCG, QUANTITATIVE, PREGNANCY: Quantitative HCG: 0.59 m[IU]/mL

## 2014-02-14 LAB — HCG, SERUM, QUALITATIVE: PREG SERUM: NEGATIVE

## 2014-03-10 NOTE — L&D Delivery Note (Signed)
Delivery Note At  a viable female was delivered via  (OA: ;  ).  APGAR: 9,9 , ; weight  3440GMS.   Placenta status: Intact via [place].  Cord: 3 vessels with the following complications:none .   Anesthesia:  none Episiotomy:  none Lacerations:  1st degree Suture Repair: 3.0 vicryl rapide Est. Blood Loss (mL):  300  Mom to postpartum.  Baby to Couplet care / Skin to Skin  Attended delivery with Tashina Credit Ines Bloomer, CNM Cassandra Elder, RN-C, SNM.  Renee Fleming 11/22/2014, 7:14 PM

## 2014-03-16 ENCOUNTER — Ambulatory Visit (INDEPENDENT_AMBULATORY_CARE_PROVIDER_SITE_OTHER): Payer: BLUE CROSS/BLUE SHIELD | Admitting: Internal Medicine

## 2014-03-16 ENCOUNTER — Other Ambulatory Visit: Payer: Self-pay | Admitting: Internal Medicine

## 2014-03-16 ENCOUNTER — Telehealth: Payer: Self-pay | Admitting: Internal Medicine

## 2014-03-16 ENCOUNTER — Encounter: Payer: Self-pay | Admitting: Internal Medicine

## 2014-03-16 VITALS — BP 108/72 | HR 80 | Temp 98.0°F | Ht 63.2 in | Wt 126.8 lb

## 2014-03-16 DIAGNOSIS — Z3490 Encounter for supervision of normal pregnancy, unspecified, unspecified trimester: Secondary | ICD-10-CM

## 2014-03-16 DIAGNOSIS — Z331 Pregnant state, incidental: Secondary | ICD-10-CM

## 2014-03-16 LAB — HCG, QUANTITATIVE, PREGNANCY: QUANTITATIVE HCG: 1046.68 m[IU]/mL

## 2014-03-16 MED ORDER — PNV PRENATAL PLUS MULTIVITAMIN 27-1 MG PO TABS: ORAL_TABLET | ORAL | Status: DC

## 2014-03-16 NOTE — Assessment & Plan Note (Signed)
Will send quant HCG. Will also check immunity to Rubella and Varicella. Discussed healthy food choices and safe medications for pregnancy. She will follow up with OB next week.

## 2014-03-16 NOTE — Telephone Encounter (Signed)
-----   Message from Saddie Bendersarolyn D Maldonado, CMA sent at 03/16/2014  1:53 PM EST ----- Can you please be able to put the order in so its the right one, and if you can put resulting agent as [facility]  Thank you  ----- Message -----    From: Shelia MediaJennifer A Sunshine Mackowski, MD    Sent: 03/16/2014   1:51 PM      To: Saddie Bendersarolyn D Maldonado, CMA  [facility]

## 2014-03-16 NOTE — Progress Notes (Signed)
Pre visit review using our clinic review tool, if applicable. No additional management support is needed unless otherwise documented below in the visit note. 

## 2014-03-16 NOTE — Patient Instructions (Signed)
Continue prenatal vitamin.  Follow up with OB as scheduled.

## 2014-03-16 NOTE — Progress Notes (Signed)
   Subjective:    Patient ID: Renee Fleming, female    DOB: [birth year], 29 y.o.   MRN: [id number]  HPI  28YO female presents for follow up.  Last menses Dec 5th. Took pregnancy test on Sunday at home and this was positive. Took total of 4 tests which were positive. Notes mild breast tenderness. No other symptoms. Taking prenatal vitamin with extra folic acid. Scheduled to see [facility] next week.   Past medical, surgical, family and social history per today's encounter.  Review of Systems  Constitutional: Negative for fever, chills, appetite change, fatigue and unexpected weight change.  Eyes: Negative for visual disturbance.  Respiratory: Negative for shortness of breath.   Cardiovascular: Negative for chest pain and leg swelling.  Gastrointestinal: Negative for nausea, vomiting, abdominal pain, diarrhea and constipation.  Skin: Negative for color change and rash.  Hematological: Negative for adenopathy. Does not bruise/bleed easily.  Psychiatric/Behavioral: Negative for sleep disturbance and dysphoric mood. The patient is not nervous/anxious.        Objective:    BP 108/72 mmHg  Pulse 80  Temp(Src) 98 F (36.7 C) (Oral)  Ht 5' 3.2" (1.605 m)  Wt 126 lb 12 oz (57.493 kg)  BMI 22.32 kg/m2  SpO2 100%  LMP 02/11/2014 Physical Exam  Constitutional: She is oriented to person, place, and time. She appears well-developed and well-nourished. No distress.  HENT:  Head: Normocephalic and atraumatic.  Right Ear: External ear normal.  Left Ear: External ear normal.  Nose: Nose normal.  Mouth/Throat: Oropharynx is clear and moist. No oropharyngeal exudate.  Eyes: Conjunctivae are normal. Pupils are equal, round, and reactive to light. Right eye exhibits no discharge. Left eye exhibits no discharge. No scleral icterus.  Neck: Normal range of motion. Neck supple. No tracheal deviation present. No thyromegaly present.  Cardiovascular: Normal rate, regular rhythm, normal heart  sounds and intact distal pulses.  Exam reveals no gallop and no friction rub.   No murmur heard. Pulmonary/Chest: Effort normal and breath sounds normal. No accessory muscle usage. No tachypnea. No respiratory distress. She has no decreased breath sounds. She has no wheezes. She has no rhonchi. She has no rales. She exhibits no tenderness.  Musculoskeletal: Normal range of motion. She exhibits no edema or tenderness.  Lymphadenopathy:    She has no cervical adenopathy.  Neurological: She is alert and oriented to person, place, and time. No cranial nerve deficit. She exhibits normal muscle tone. Coordination normal.  Skin: Skin is warm and dry. No rash noted. She is not diaphoretic. No erythema. No pallor.  Psychiatric: She has a normal mood and affect. Her behavior is normal. Judgment and thought content normal.          Assessment & Plan:   Problem List Items Addressed This Visit      Unprioritized   Pregnancy - Primary    Will send quant HCG. Will also check immunity to Rubella and Varicella. Discussed healthy food choices and safe medications for pregnancy. She will follow up with OB next week.    Relevant Orders      hCG, quantitative, pregnancy      Measles/Mumps/Rubella Immunity      Varicella zoster antibody, IgG       Return if symptoms worsen or fail to improve.

## 2014-03-17 LAB — MEASLES/MUMPS/RUBELLA IMMUNITY
MUMPS IGG: 6.86 [AU]/ml (ref <9.00)
RUBEOLA IGG: 119 [AU]/ml — AB (ref <25.00)
Rubella: 0.85 Index (ref <0.90)

## 2014-03-17 LAB — HCG, QUANTITATIVE, PREGNANCY: HCG, BETA CHAIN, QUANT, S: 1025.5 m[IU]/mL

## 2014-03-17 LAB — VARICELLA ZOSTER ANTIBODY, IGG: Varicella IgG: 642.6 Index — ABNORMAL HIGH (ref <135.00)

## 2014-03-21 LAB — OB RESULTS CONSOLE GC/CHLAMYDIA
Chlamydia: NEGATIVE
Gonorrhea: NEGATIVE

## 2014-03-21 LAB — OB RESULTS CONSOLE HGB/HCT, BLOOD
HEMATOCRIT: 37 %
HEMOGLOBIN: 13.1 g/dL

## 2014-03-21 LAB — OB RESULTS CONSOLE ANTIBODY SCREEN: ANTIBODY SCREEN: NEGATIVE

## 2014-03-21 LAB — OB RESULTS CONSOLE ABO/RH: RH Type: POSITIVE

## 2014-03-21 LAB — OB RESULTS CONSOLE HIV ANTIBODY (ROUTINE TESTING): HIV: NONREACTIVE

## 2014-03-21 LAB — OB RESULTS CONSOLE PLATELET COUNT: Platelets: 252 10*3/uL

## 2014-03-21 LAB — OB RESULTS CONSOLE RPR: RPR: NONREACTIVE

## 2014-03-21 LAB — OB RESULTS CONSOLE HEPATITIS B SURFACE ANTIGEN: Hepatitis B Surface Ag: NEGATIVE

## 2014-03-24 ENCOUNTER — Encounter: Payer: Self-pay | Admitting: Internal Medicine

## 2014-03-27 ENCOUNTER — Ambulatory Visit: Payer: Self-pay | Admitting: Obstetrics and Gynecology

## 2014-03-27 LAB — HCG, QUANTITATIVE, PREGNANCY: Beta Hcg, Quant.: 17211 m[IU]/mL — ABNORMAL HIGH

## 2014-08-11 ENCOUNTER — Encounter: Payer: Self-pay | Admitting: Obstetrics and Gynecology

## 2014-08-11 ENCOUNTER — Ambulatory Visit (INDEPENDENT_AMBULATORY_CARE_PROVIDER_SITE_OTHER): Payer: BLUE CROSS/BLUE SHIELD | Admitting: Obstetrics and Gynecology

## 2014-08-11 VITALS — BP 106/63 | HR 82 | Wt 135.7 lb

## 2014-08-11 DIAGNOSIS — Z3482 Encounter for supervision of other normal pregnancy, second trimester: Secondary | ICD-10-CM

## 2014-08-11 DIAGNOSIS — Z3493 Encounter for supervision of normal pregnancy, unspecified, third trimester: Secondary | ICD-10-CM

## 2014-08-11 LAB — POCT URINALYSIS DIPSTICK
BILIRUBIN UA: NEGATIVE
Blood, UA: NEGATIVE
Glucose, UA: NEGATIVE
KETONES UA: NEGATIVE
Leukocytes, UA: NEGATIVE
Nitrite, UA: NEGATIVE
Protein, UA: NEGATIVE
SPEC GRAV UA: 1.02
Urobilinogen, UA: 0.2
pH, UA: 5

## 2014-08-11 NOTE — Patient Instructions (Signed)
Place 24-38 weeks prenatal visit patient instructions here.

## 2014-08-11 NOTE — Progress Notes (Signed)
Pt is c/o contractions

## 2014-08-11 NOTE — Progress Notes (Signed)
ROB- doing well, reports occass nonpainful uterine tightening; denies decreased FM; glucola next visit.Plans to name female infant 'Paxley"

## 2014-08-24 ENCOUNTER — Encounter: Payer: Self-pay | Admitting: Obstetrics and Gynecology

## 2014-08-24 ENCOUNTER — Ambulatory Visit (INDEPENDENT_AMBULATORY_CARE_PROVIDER_SITE_OTHER): Payer: BLUE CROSS/BLUE SHIELD | Admitting: Obstetrics and Gynecology

## 2014-08-24 VITALS — BP 96/74 | HR 72 | Wt 137.5 lb

## 2014-08-24 DIAGNOSIS — Z3493 Encounter for supervision of normal pregnancy, unspecified, third trimester: Secondary | ICD-10-CM

## 2014-08-24 LAB — POCT URINALYSIS DIPSTICK
Bilirubin, UA: NEGATIVE
Blood, UA: NEGATIVE
GLUCOSE UA: NEGATIVE
KETONES UA: NEGATIVE
Leukocytes, UA: NEGATIVE
Nitrite, UA: NEGATIVE
Protein, UA: NEGATIVE
SPEC GRAV UA: 1.01
UROBILINOGEN UA: 0.2
pH, UA: 6.5

## 2014-08-24 MED ORDER — TETANUS-DIPHTH-ACELL PERTUSSIS 5-2.5-18.5 LF-MCG/0.5 IM SUSP
0.5000 mL | Freq: Once | INTRAMUSCULAR | Status: AC
  Administered 2014-08-24: 0.5 mL via INTRAMUSCULAR

## 2014-08-24 NOTE — Progress Notes (Signed)
Pt denies any complaints.

## 2014-08-24 NOTE — Progress Notes (Signed)
ROB & Glucola- doing well w/o complaint; Tdap given & blood consent signed;

## 2014-08-24 NOTE — Patient Instructions (Signed)
Place 24-38 weeks prenatal visit patient instructions here.

## 2014-08-25 LAB — HEMOGLOBIN AND HEMATOCRIT, BLOOD
HEMATOCRIT: 32.1 % — AB (ref 34.0–46.6)
Hemoglobin: 11.2 g/dL (ref 11.1–15.9)

## 2014-08-25 LAB — GLUCOSE, 1 HOUR GESTATIONAL: Gestational Diabetes Screen: 124 mg/dL (ref 65–139)

## 2014-09-07 ENCOUNTER — Ambulatory Visit (INDEPENDENT_AMBULATORY_CARE_PROVIDER_SITE_OTHER): Payer: BLUE CROSS/BLUE SHIELD | Admitting: Obstetrics and Gynecology

## 2014-09-07 ENCOUNTER — Encounter: Payer: Self-pay | Admitting: Obstetrics and Gynecology

## 2014-09-07 VITALS — BP 104/69 | HR 71 | Wt 141.0 lb

## 2014-09-07 DIAGNOSIS — Z3493 Encounter for supervision of normal pregnancy, unspecified, third trimester: Secondary | ICD-10-CM

## 2014-09-07 LAB — POCT URINALYSIS DIPSTICK
Bilirubin, UA: NEGATIVE
Blood, UA: NEGATIVE
GLUCOSE UA: NEGATIVE
Ketones, UA: NEGATIVE
Leukocytes, UA: NEGATIVE
NITRITE UA: NEGATIVE
Spec Grav, UA: 1.01
Urobilinogen, UA: 0.2
pH, UA: 6

## 2014-09-07 NOTE — Progress Notes (Signed)
Pt denies any complaints.

## 2014-09-07 NOTE — Progress Notes (Signed)
ROB- doing well w/o concerns, reviewed normal glucola

## 2014-09-21 ENCOUNTER — Encounter: Payer: BLUE CROSS/BLUE SHIELD | Admitting: Obstetrics and Gynecology

## 2014-09-22 ENCOUNTER — Ambulatory Visit (INDEPENDENT_AMBULATORY_CARE_PROVIDER_SITE_OTHER): Payer: BLUE CROSS/BLUE SHIELD | Admitting: Nurse Practitioner

## 2014-09-22 ENCOUNTER — Telehealth: Payer: Self-pay | Admitting: Internal Medicine

## 2014-09-22 ENCOUNTER — Observation Stay
Admission: EM | Admit: 2014-09-22 | Discharge: 2014-09-22 | Disposition: A | Discharge status: Home / Self Care | Payer: BLUE CROSS/BLUE SHIELD | Attending: Obstetrics and Gynecology | Admitting: Obstetrics and Gynecology

## 2014-09-22 VITALS — BP 106/70 | HR 79 | Temp 98.4°F | Resp 16 | Ht 63.2 in | Wt 139.6 lb

## 2014-09-22 DIAGNOSIS — K529 Noninfective gastroenteritis and colitis, unspecified: Secondary | ICD-10-CM | POA: Diagnosis not present

## 2014-09-22 DIAGNOSIS — O26899 Other specified pregnancy related conditions, unspecified trimester: Secondary | ICD-10-CM | POA: Diagnosis not present

## 2014-09-22 DIAGNOSIS — R197 Diarrhea, unspecified: Secondary | ICD-10-CM | POA: Diagnosis not present

## 2014-09-22 LAB — BASIC METABOLIC PANEL
ANION GAP: 8 (ref 5–15)
BUN: 6 mg/dL (ref 6–20)
CO2: 20 mmol/L — ABNORMAL LOW (ref 22–32)
CREATININE: 0.41 mg/dL — AB (ref 0.44–1.00)
Calcium: 9.1 mg/dL (ref 8.9–10.3)
Chloride: 107 mmol/L (ref 101–111)
GFR calc Af Amer: >60 mL/min (ref >60)
GFR calc non Af Amer: >60 mL/min (ref >60)
Glucose, Bld: 94 mg/dL (ref 65–99)
Potassium: 3.7 mmol/L (ref 3.5–5.1)
Sodium: 135 mmol/L (ref 135–145)

## 2014-09-22 MED ORDER — BISMUTH SUBSALICYLATE 262 MG/15ML PO SUSP
30.0000 mL | ORAL | Status: DC | PRN
  Filled 2014-09-22: qty 118

## 2014-09-22 NOTE — Progress Notes (Signed)
Pt. Complaining of diarrhea that started 5 days ago. Diarrhea stopped for 2 days and returned with vomiting that started yesterday. Last emesis was 1545 yesterday 09/21/2014. Diarrhea persists. No contractions felt, positive fetal movement. Daughter has been sick with similar symptoms that resolved spontaneously.

## 2014-09-22 NOTE — Progress Notes (Signed)
Dr. Valentino Saxonherry came to see patient. Pt was upset about not receiving fluids and being sent home. Lab values and lack of contractions were explained to patient.

## 2014-09-22 NOTE — Telephone Encounter (Signed)
FYI, your 1145 appt today

## 2014-09-22 NOTE — Progress Notes (Signed)
Pre visit review using our clinic review tool, if applicable. No additional management support is needed unless otherwise documented below in the visit note. 

## 2014-09-22 NOTE — Patient Instructions (Signed)
It is advised to go to the ER to have fluid replacement and have them check out the baby.

## 2014-09-22 NOTE — Progress Notes (Signed)
Monitor D/C for discharge. Reviewed discharge instructions with patient. Including reasons to return. Ambulatory on discharge.

## 2014-09-22 NOTE — Progress Notes (Signed)
   Subjective:    Patient ID: Renee SicklesAlyson Fleming, female    DOB: 01-22-1986, 29 y.o.   MRN: [id number]  HPI  Renee Fleming is a 29 yo female with a CC of Diarrhea.   1) 29 yr old had diarrhea and nausea  Thursday vomited twice and bathroom about 15 times today- water, yellowish. No vomiting since 3:30 pm yesterday, feels queasy today   Gassy, bloating, heartburn, diarrhea   Stopped Tuesday/Wednesday   Able to eat  Drinking water, gatorade, soup  Urinating- yellow  Puppy- recently around new puppy treated for parasitic infection.   BP-106/70 Pulse 79  99o2 sitting BP- 102/70 Pulse- 111 9902  standing BP 100/66 Pulse 88 99o2 lying   Missed 32 week appointment due to diarrhea/vomiting  Review of Systems  Constitutional: Positive for fatigue. Negative for fever, chills, diaphoresis and appetite change.  Respiratory: Negative for chest tightness, shortness of breath and wheezing.   Cardiovascular: Negative for chest pain, palpitations and leg swelling.  Gastrointestinal: Positive for diarrhea and abdominal distention. Negative for nausea, vomiting, abdominal pain, blood in stool and anal bleeding.  Skin: Negative for rash.  Neurological: Negative for dizziness, weakness, numbness and headaches.  Psychiatric/Behavioral: The patient is not nervous/anxious.        Objective:   Physical Exam  Constitutional: She is oriented to person, place, and time. She appears well-developed and well-nourished. No distress.  BP 106/70 mmHg  Pulse 79  Temp(Src) 98.4 F (36.9 C)  Resp 16  Ht 5' 3.2" (1.605 m)  Wt 139 lb 9.6 oz (63.322 kg)  BMI 24.58 kg/m2  SpO2 99%  LMP 02/11/2014 (LMP Unknown)   HENT:  Head: Normocephalic and atraumatic.  Right Ear: External ear normal.  Left Ear: External ear normal.  Eyes: EOM are normal. Pupils are equal, round, and reactive to light. Right eye exhibits no discharge. Left eye exhibits no discharge. No scleral icterus.  Cardiovascular: Normal rate, regular rhythm  and normal heart sounds.   Pulmonary/Chest: Effort normal and breath sounds normal. No respiratory distress. She has no wheezes. She has no rales. She exhibits no tenderness.  Abdominal: Bowel sounds are normal. There is no tenderness.  [redacted] weeks pregnant- no tenderness of abdomen.  Neurological: She is alert and oriented to person, place, and time. No cranial nerve deficit. She exhibits normal muscle tone. Coordination normal.  Skin: Skin is warm and dry. No rash noted. She is not diaphoretic.  Psychiatric: She has a normal mood and affect. Her behavior is normal. Judgment and thought content normal.       Assessment & Plan:

## 2014-09-22 NOTE — Telephone Encounter (Signed)
Patient Name: Renee SicklesLYSON Fleming DOB: 01-07-1986 Initial Comment [name] states she is [redacted] wks pregnant, her daughter was sent home last week for a bug. She has diarrhea. Bloating. 15 or more loose stools, and not even sure if she is urinating. Concerned about dehydration. Nurse Assessment Nurse: Yetta BarreJones, RN, Miranda Date/Time (Eastern Time): 09/22/2014 8:14:38 AM Confirm and document reason for call. If symptomatic, describe symptoms. ---[name] states she is pregnant and has been having diarrhea from Sat - Monday, fine Tuesday and Wed, yesterday started having severe diarrhea and vomiting, last episode of emesis was yesterday afternoon. Has the patient traveled out of the country within the last 30 days? ---No Does the patient require triage? ---Yes Related visit to physician within the last 2 weeks? ---No Does the PT have any chronic conditions? (i.e. diabetes, asthma, etc.) ---No Did the patient indicate they were pregnant? ---Yes How many weeks gestation? ---32 weeks Have you felt decreased fetal movement? ---No Guidelines Guideline Title Affirmed Question Affirmed Notes Diarrhea [1] Age < 61 years AND [2] > 15 diarrhea stools in past 24 hours Final Disposition User See Physician within 4 Hours (or PCP triage) Yetta BarreJones, RN, Miranda Comments Called backline and verified with Melissa that they will see a pregnant patient if symptoms are not pregnancy related. Appt scheduled for today at 11:15 with Naomie Deanarrie Doss

## 2014-09-28 ENCOUNTER — Encounter: Payer: Self-pay | Admitting: Obstetrics and Gynecology

## 2014-09-28 ENCOUNTER — Ambulatory Visit (INDEPENDENT_AMBULATORY_CARE_PROVIDER_SITE_OTHER): Payer: BLUE CROSS/BLUE SHIELD | Admitting: Obstetrics and Gynecology

## 2014-09-28 VITALS — BP 101/72 | HR 71 | Wt 143.4 lb

## 2014-09-28 DIAGNOSIS — Z3493 Encounter for supervision of normal pregnancy, unspecified, third trimester: Secondary | ICD-10-CM

## 2014-09-28 LAB — POCT URINALYSIS DIPSTICK
Bilirubin, UA: NEGATIVE
Glucose, UA: NEGATIVE
Ketones, UA: NEGATIVE
LEUKOCYTES UA: NEGATIVE
NITRITE UA: NEGATIVE
Protein, UA: NEGATIVE
Spec Grav, UA: <=1.005
UROBILINOGEN UA: 0.2
pH, UA: 6.5

## 2014-09-28 NOTE — Progress Notes (Signed)
Pt denies any complaints.

## 2014-09-28 NOTE — Progress Notes (Signed)
ROB-doing well, considering OCPs PP until spouse has vasectomy

## 2014-09-29 ENCOUNTER — Other Ambulatory Visit: Payer: Self-pay | Admitting: Obstetrics and Gynecology

## 2014-09-29 ENCOUNTER — Telehealth: Payer: Self-pay | Admitting: Obstetrics and Gynecology

## 2014-09-29 MED ORDER — RANITIDINE HCL 150 MG PO TABS: 150.0000 mg | ORAL_TABLET | Freq: Two times a day (BID) | ORAL | Status: DC

## 2014-09-29 NOTE — Telephone Encounter (Signed)
Patient called requesting a script for acid reflux meds [facility] [address].Thanks

## 2014-09-29 NOTE — Telephone Encounter (Signed)
Left detailed message rx sent  °

## 2014-09-29 NOTE — Telephone Encounter (Signed)
pls advise

## 2014-09-29 NOTE — Telephone Encounter (Signed)
Please let her know it was sent in

## 2014-10-03 ENCOUNTER — Encounter: Payer: Self-pay | Admitting: Nurse Practitioner

## 2014-10-03 NOTE — Assessment & Plan Note (Signed)
Pt was sent to hospital for check on fetus and possible fluids. Pt was agreeable and went by POV. FU prn worsening/failure to improve.

## 2014-10-12 ENCOUNTER — Encounter: Payer: Self-pay | Admitting: Obstetrics and Gynecology

## 2014-10-12 ENCOUNTER — Ambulatory Visit (INDEPENDENT_AMBULATORY_CARE_PROVIDER_SITE_OTHER): Payer: BLUE CROSS/BLUE SHIELD | Admitting: Obstetrics and Gynecology

## 2014-10-12 VITALS — BP 103/62 | HR 71 | Wt 147.0 lb

## 2014-10-12 DIAGNOSIS — Z3493 Encounter for supervision of normal pregnancy, unspecified, third trimester: Secondary | ICD-10-CM

## 2014-10-12 LAB — POCT URINALYSIS DIPSTICK
Bilirubin, UA: NEGATIVE
Glucose, UA: NEGATIVE
Ketones, UA: NEGATIVE
Leukocytes, UA: NEGATIVE
NITRITE UA: NEGATIVE
PROTEIN UA: NEGATIVE
RBC UA: NEGATIVE
SPEC GRAV UA: 1.015
UROBILINOGEN UA: 0.2
pH, UA: 6.5

## 2014-10-12 NOTE — Progress Notes (Signed)
Pt is doing well, feels great

## 2014-10-12 NOTE — Progress Notes (Signed)
ROB-doing well, no concerns. 

## 2014-10-13 ENCOUNTER — Encounter: Payer: BLUE CROSS/BLUE SHIELD | Admitting: Obstetrics and Gynecology

## 2014-10-24 ENCOUNTER — Ambulatory Visit (INDEPENDENT_AMBULATORY_CARE_PROVIDER_SITE_OTHER): Payer: BLUE CROSS/BLUE SHIELD | Admitting: Obstetrics and Gynecology

## 2014-10-24 ENCOUNTER — Encounter: Payer: Self-pay | Admitting: Obstetrics and Gynecology

## 2014-10-24 VITALS — BP 111/81 | HR 84 | Wt 150.0 lb

## 2014-10-24 DIAGNOSIS — Z113 Encounter for screening for infections with a predominantly sexual mode of transmission: Secondary | ICD-10-CM

## 2014-10-24 DIAGNOSIS — Z3685 Encounter for antenatal screening for Streptococcus B: Secondary | ICD-10-CM

## 2014-10-24 DIAGNOSIS — Z3493 Encounter for supervision of normal pregnancy, unspecified, third trimester: Secondary | ICD-10-CM

## 2014-10-24 DIAGNOSIS — Z36 Encounter for antenatal screening of mother: Secondary | ICD-10-CM

## 2014-10-24 LAB — POCT URINALYSIS DIPSTICK
Bilirubin, UA: NEGATIVE
Blood, UA: NEGATIVE
Glucose, UA: NEGATIVE
Ketones, UA: NEGATIVE
Leukocytes, UA: NEGATIVE
Nitrite, UA: NEGATIVE
PH UA: 5
PROTEIN UA: NEGATIVE
SPEC GRAV UA: 1.01
UROBILINOGEN UA: 0.2

## 2014-10-24 NOTE — Progress Notes (Signed)
ROB-cultures today, pt denies any complaints

## 2014-10-24 NOTE — Progress Notes (Signed)
ROB- doing well, reports increased but irregular BHC; labor precautions discussed, does not want epidural due to previous bad experiences. Cultures obtained.

## 2014-10-26 LAB — STREP GP B NAA: Strep Gp B NAA: NEGATIVE

## 2014-10-27 LAB — GC/CHLAMYDIA PROBE AMP
CHLAMYDIA, DNA PROBE: NEGATIVE
Neisseria gonorrhoeae by PCR: NEGATIVE

## 2014-10-31 ENCOUNTER — Encounter: Payer: Self-pay | Admitting: Obstetrics and Gynecology

## 2014-10-31 ENCOUNTER — Ambulatory Visit (INDEPENDENT_AMBULATORY_CARE_PROVIDER_SITE_OTHER): Payer: BLUE CROSS/BLUE SHIELD | Admitting: Obstetrics and Gynecology

## 2014-10-31 VITALS — BP 103/72 | HR 88 | Wt 152.9 lb

## 2014-10-31 DIAGNOSIS — Z331 Pregnant state, incidental: Secondary | ICD-10-CM

## 2014-10-31 LAB — POCT URINALYSIS DIPSTICK
BILIRUBIN UA: NEGATIVE
Blood, UA: NEGATIVE
Glucose, UA: NEGATIVE
KETONES UA: NEGATIVE
Nitrite, UA: NEGATIVE
Protein, UA: NEGATIVE
Urobilinogen, UA: 0.2
pH, UA: 6

## 2014-10-31 NOTE — Progress Notes (Signed)
ROB- reviewed negative cultures; labor precautions reiterated.

## 2014-10-31 NOTE — Progress Notes (Signed)
ROB- having some increased contractions, pelvic pain

## 2014-11-08 ENCOUNTER — Ambulatory Visit (INDEPENDENT_AMBULATORY_CARE_PROVIDER_SITE_OTHER): Payer: BLUE CROSS/BLUE SHIELD | Admitting: Obstetrics and Gynecology

## 2014-11-08 ENCOUNTER — Encounter: Payer: Self-pay | Admitting: Obstetrics and Gynecology

## 2014-11-08 VITALS — BP 116/77 | HR 104 | Wt 154.0 lb

## 2014-11-08 DIAGNOSIS — Z331 Pregnant state, incidental: Secondary | ICD-10-CM

## 2014-11-08 LAB — POCT URINALYSIS DIPSTICK
Bilirubin, UA: NEGATIVE
Blood, UA: NEGATIVE
Glucose, UA: NEGATIVE
Ketones, UA: NEGATIVE
LEUKOCYTES UA: NEGATIVE
Nitrite, UA: NEGATIVE
PROTEIN UA: NEGATIVE
SPEC GRAV UA: 1.01
UROBILINOGEN UA: 0.2
pH, UA: 6

## 2014-11-08 NOTE — Progress Notes (Signed)
ROB- doing well. 

## 2014-11-08 NOTE — Progress Notes (Signed)
ROB-denies any complaints 

## 2014-11-16 ENCOUNTER — Encounter: Payer: Self-pay | Admitting: Obstetrics and Gynecology

## 2014-11-16 ENCOUNTER — Ambulatory Visit (INDEPENDENT_AMBULATORY_CARE_PROVIDER_SITE_OTHER): Payer: BLUE CROSS/BLUE SHIELD | Admitting: Obstetrics and Gynecology

## 2014-11-16 VITALS — BP 109/73 | HR 84 | Wt 154.3 lb

## 2014-11-16 DIAGNOSIS — Z3493 Encounter for supervision of normal pregnancy, unspecified, third trimester: Secondary | ICD-10-CM

## 2014-11-16 LAB — POCT URINALYSIS DIPSTICK
BILIRUBIN UA: NEGATIVE
Blood, UA: NEGATIVE
GLUCOSE UA: NEGATIVE
Ketones, UA: NEGATIVE
Leukocytes, UA: NEGATIVE
Nitrite, UA: NEGATIVE
Protein, UA: NEGATIVE
SPEC GRAV UA: 1.01
Urobilinogen, UA: 0.2
pH, UA: 6.5

## 2014-11-16 NOTE — Progress Notes (Signed)
ROB-c/o loose stools, some pelvic pressure

## 2014-11-16 NOTE — Progress Notes (Signed)
ROB- doing well, no change since last visit

## 2014-11-21 ENCOUNTER — Encounter: Payer: Self-pay | Admitting: Obstetrics and Gynecology

## 2014-11-21 ENCOUNTER — Ambulatory Visit (INDEPENDENT_AMBULATORY_CARE_PROVIDER_SITE_OTHER): Payer: BLUE CROSS/BLUE SHIELD | Admitting: Obstetrics and Gynecology

## 2014-11-21 ENCOUNTER — Ambulatory Visit: Payer: BLUE CROSS/BLUE SHIELD

## 2014-11-21 VITALS — BP 112/76 | HR 88 | Wt 157.5 lb

## 2014-11-21 DIAGNOSIS — Z331 Pregnant state, incidental: Secondary | ICD-10-CM

## 2014-11-21 DIAGNOSIS — Z3493 Encounter for supervision of normal pregnancy, unspecified, third trimester: Secondary | ICD-10-CM

## 2014-11-21 LAB — POCT URINALYSIS DIPSTICK
BILIRUBIN UA: NEGATIVE
Blood, UA: NEGATIVE
Glucose, UA: NEGATIVE
Ketones, UA: NEGATIVE
Leukocytes, UA: NEGATIVE
Nitrite, UA: NEGATIVE
Protein, UA: NEGATIVE
SPEC GRAV UA: 1.01
UROBILINOGEN UA: 0.2
pH, UA: 6

## 2014-11-21 MED ORDER — INFLUENZA VAC SPLIT QUAD 0.5 ML IM SUSY
0.5000 mL | PREFILLED_SYRINGE | Freq: Once | INTRAMUSCULAR | Status: AC
  Administered 2014-11-21: 0.5 mL via INTRAMUSCULAR

## 2014-11-21 NOTE — Progress Notes (Signed)
ROB-increased pelvic pressure," she is ready to have this baby"

## 2014-11-21 NOTE — Progress Notes (Signed)
Indications:AFI;EFW Findings:  Mason Jim intrauterine pregnancy is visualized with FHR at 160 BPM. Biometrics give an (U/S) Gestational age of 25 5/7 weeks and an (U/S) EDD of 11-23-14; this correlates with the clinically established EDD of 11-18-14.  Fetal presentation is Vertex.  EFW: 3964 grams, 8 lbs 12 oz = 79%. Placenta: Anterior grade 3. AFI: 17.1cm.     Impression: 1. 39 5/7 week Viable Singleton Intrauterine pregnancy by U/S. 2. (U/S) EDD is consistent with Clinically established (LMP) EDD of 11-18-14. 3. AFI = 17.1 cm.  NST reactive Labor precautions reiterated

## 2014-11-22 ENCOUNTER — Inpatient Hospital Stay
Admission: EM | Admit: 2014-11-22 | Discharge: 2014-11-24 | DRG: 775 | Disposition: A | Discharge status: Home / Self Care | Payer: BLUE CROSS/BLUE SHIELD | Attending: Obstetrics and Gynecology | Admitting: Obstetrics and Gynecology

## 2014-11-22 ENCOUNTER — Encounter: Payer: Self-pay | Admitting: *Deleted

## 2014-11-22 DIAGNOSIS — O9902 Anemia complicating childbirth: Secondary | ICD-10-CM | POA: Diagnosis not present

## 2014-11-22 DIAGNOSIS — Z3A40 40 weeks gestation of pregnancy: Secondary | ICD-10-CM | POA: Diagnosis present

## 2014-11-22 DIAGNOSIS — D649 Anemia, unspecified: Secondary | ICD-10-CM | POA: Diagnosis not present

## 2014-11-22 DIAGNOSIS — O480 Post-term pregnancy: Principal | ICD-10-CM | POA: Diagnosis present

## 2014-11-22 DIAGNOSIS — Z3490 Encounter for supervision of normal pregnancy, unspecified, unspecified trimester: Secondary | ICD-10-CM

## 2014-11-22 DIAGNOSIS — Z3493 Encounter for supervision of normal pregnancy, unspecified, third trimester: Secondary | ICD-10-CM

## 2014-11-22 LAB — CBC
HEMATOCRIT: 34.8 % — AB (ref 35.0–47.0)
Hemoglobin: 11.6 g/dL — ABNORMAL LOW (ref 12.0–16.0)
MCH: 28.3 pg (ref 26.0–34.0)
MCHC: 33.3 g/dL (ref 32.0–36.0)
MCV: 85 fL (ref 80.0–100.0)
PLATELETS: 163 10*3/uL (ref 150–440)
RBC: 4.09 MIL/uL (ref 3.80–5.20)
RDW: 12.7 % (ref 11.5–14.5)
WBC: 12.7 10*3/uL — AB (ref 3.6–11.0)

## 2014-11-22 LAB — ABO/RH: ABO/RH(D): B POS

## 2014-11-22 LAB — TYPE AND SCREEN
ABO/RH(D): B POS
ANTIBODY SCREEN: NEGATIVE

## 2014-11-22 MED ORDER — OXYTOCIN BOLUS FROM INFUSION: 500.0000 mL | INTRAVENOUS | Status: DC

## 2014-11-22 MED ORDER — WITCH HAZEL-GLYCERIN EX PADS: 1.0000 "application " | MEDICATED_PAD | CUTANEOUS | Status: DC | PRN

## 2014-11-22 MED ORDER — OXYCODONE-ACETAMINOPHEN 5-325 MG PO TABS: 1.0000 | ORAL_TABLET | ORAL | Status: DC | PRN

## 2014-11-22 MED ORDER — SENNOSIDES-DOCUSATE SODIUM 8.6-50 MG PO TABS
2.0000 | ORAL_TABLET | ORAL | Status: DC
  Administered 2014-11-23: 2 via ORAL
  Filled 2014-11-22: qty 2

## 2014-11-22 MED ORDER — BENZOCAINE-MENTHOL 20-0.5 % EX AERO: 1.0000 "application " | INHALATION_SPRAY | CUTANEOUS | Status: DC | PRN

## 2014-11-22 MED ORDER — ONDANSETRON HCL 4 MG PO TABS: 4.0000 mg | ORAL_TABLET | ORAL | Status: DC | PRN

## 2014-11-22 MED ORDER — OXYCODONE-ACETAMINOPHEN 5-325 MG PO TABS: 2.0000 | ORAL_TABLET | ORAL | Status: DC | PRN

## 2014-11-22 MED ORDER — OXYTOCIN 40 UNITS IN LACTATED RINGERS INFUSION - SIMPLE MED
1.0000 m[IU]/min | INTRAVENOUS | Status: DC
  Administered 2014-11-22: 1 m[IU]/min via INTRAVENOUS

## 2014-11-22 MED ORDER — LACTATED RINGERS IV SOLN
INTRAVENOUS | Status: DC
  Administered 2014-11-22: 1000 mL via INTRAVENOUS

## 2014-11-22 MED ORDER — ONDANSETRON HCL 4 MG/2ML IJ SOLN: 4.0000 mg | Freq: Four times a day (QID) | INTRAMUSCULAR | Status: DC | PRN

## 2014-11-22 MED ORDER — OXYTOCIN 40 UNITS IN LACTATED RINGERS INFUSION - SIMPLE MED: 62.5000 mL/h | INTRAVENOUS | Status: DC | PRN

## 2014-11-22 MED ORDER — ACETAMINOPHEN 325 MG PO TABS: 650.0000 mg | ORAL_TABLET | ORAL | Status: DC | PRN

## 2014-11-22 MED ORDER — LIDOCAINE HCL (PF) 1 % IJ SOLN
30.0000 mL | INTRAMUSCULAR | Status: DC | PRN
  Filled 2014-11-22: qty 30

## 2014-11-22 MED ORDER — FENTANYL CITRATE (PF) 100 MCG/2ML IJ SOLN: 50.0000 ug | INTRAMUSCULAR | Status: DC | PRN

## 2014-11-22 MED ORDER — LANOLIN HYDROUS EX OINT: TOPICAL_OINTMENT | CUTANEOUS | Status: DC | PRN

## 2014-11-22 MED ORDER — TERBUTALINE SULFATE 1 MG/ML IJ SOLN: 0.2500 mg | Freq: Once | INTRAMUSCULAR | Status: DC | PRN

## 2014-11-22 MED ORDER — DIBUCAINE 1 % RE OINT: 1.0000 "application " | TOPICAL_OINTMENT | RECTAL | Status: DC | PRN

## 2014-11-22 MED ORDER — SIMETHICONE 80 MG PO CHEW: 80.0000 mg | CHEWABLE_TABLET | ORAL | Status: DC | PRN

## 2014-11-22 MED ORDER — OXYTOCIN 40 UNITS IN LACTATED RINGERS INFUSION - SIMPLE MED
62.5000 mL/h | INTRAVENOUS | Status: DC
  Filled 2014-11-22: qty 1000

## 2014-11-22 MED ORDER — LACTATED RINGERS IV SOLN: 500.0000 mL | INTRAVENOUS | Status: DC | PRN

## 2014-11-22 MED ORDER — IBUPROFEN 600 MG PO TABS
600.0000 mg | ORAL_TABLET | Freq: Four times a day (QID) | ORAL | Status: DC
  Administered 2014-11-23 – 2014-11-24 (×3): 600 mg via ORAL
  Filled 2014-11-22 (×4): qty 1

## 2014-11-22 MED ORDER — SODIUM CHLORIDE 0.9 % IJ SOLN
INTRAMUSCULAR | Status: AC
  Filled 2014-11-22: qty 3

## 2014-11-22 MED ORDER — DIPHENHYDRAMINE HCL 25 MG PO CAPS: 25.0000 mg | ORAL_CAPSULE | Freq: Four times a day (QID) | ORAL | Status: DC | PRN

## 2014-11-22 MED ORDER — IBUPROFEN 600 MG PO TABS
ORAL_TABLET | ORAL | Status: AC
  Filled 2014-11-22: qty 1

## 2014-11-22 MED ORDER — PRENATAL MULTIVITAMIN CH
1.0000 | ORAL_TABLET | Freq: Every day | ORAL | Status: DC
Start: 2014-11-23 — End: 2014-11-24
  Administered 2014-11-23 – 2014-11-24 (×2): 1 via ORAL
  Filled 2014-11-22 (×2): qty 1

## 2014-11-22 MED ORDER — ONDANSETRON HCL 4 MG/2ML IJ SOLN: 4.0000 mg | INTRAMUSCULAR | Status: DC | PRN

## 2014-11-22 NOTE — Progress Notes (Signed)
Subjective: Reports increased vaginal pressure with contractions  Objective: BP 116/82 mmHg  Pulse 100  Temp(Src) 98.5 F (36.9 C) (Oral)  Resp 20  Ht  (1.549 m)  Wt 71.215 kg (157 lb)  BMI 29.68 kg/m2  LMP 02/11/2014 (LMP Unknown)      FHT:  FHR: 135 bpm, variability: moderate,  accelerations:  Present,  decelerations:  Absent UC:   regular, every 3 minutes, on 5 mu/m of  pitocin SVE:   Dilation: 8 Exam by:: mnb  Labs: Lab Results  Component Value Date   WBC 12.7* 11/22/2014   HGB 11.6* 11/22/2014   HCT 34.8* 11/22/2014   MCV 85.0 11/22/2014   PLT 163 11/22/2014    Assessment / Plan: Augmentation of labor, progressing well  Labor: Progressing normally Preeclampsia:  labs stable Fetal Wellbeing:  Category I Pain Control:  Labor support without medications I/D:  n/a Anticipated MOD:  NSVD  Cyla Haluska Burr 11/22/2014, 5:43 PM

## 2014-11-22 NOTE — Progress Notes (Signed)
[name] is a 29 y.o. [id number] at [redacted]w[redacted]d by LMP admitted for active labor  Subjective:   Objective: BP 123/80 mmHg  Pulse 97  Temp(Src) 98.5 F (36.9 C) (Oral)  Resp 20  Ht  (1.549 m)  Wt 71.215 kg (157 lb)  BMI 29.68 kg/m2  LMP 02/11/2014 (LMP Unknown)      Fetal Wellbeing:  Category I UC:   irregular, every 10 minutes SVE:    5/80/-2, posterior  Labs: Lab Results  Component Value Date   WBC 12.7* 11/22/2014   HGB 11.6* 11/22/2014   HCT 34.8* 11/22/2014   MCV 85.0 11/22/2014   PLT 163 11/22/2014    Assessment / Plan: AROM- clear fluid with scant bloody show  Labor: Progressing normally Preeclampsia:  labs stable Pain Control:  Labor support without medications Anticipated MOD:  NSVD  [name] 11/22/2014, 1:25 PM

## 2014-11-22 NOTE — Progress Notes (Signed)
Fetal Heart Tone 145. Pt off monitor to walk.

## 2014-11-22 NOTE — H&P (Signed)
Obstetric History and Physical  Renee Fleming is a 29 y.o. Z6X0960 with IUP at [redacted]w[redacted]d presenting with contractions. Patient states she has been having  irregular, every 5 minutes contractions, minimal vaginal bleeding, intact membranes, with active fetal movement.    Prenatal Course Source of Care: [facility]  Pregnancy complications or risks:none  Prenatal labs and studies: ABO, Rh: B/Positive/-- (01/12 0000) Antibody: Negative (01/12 0000) Rubella: 0.85 (01/07 0837) RPR: Nonreactive (01/12 0000)  HBsAg: Negative (01/12 0000)  HIV: Non-reactive (01/12 0000)  GBS: negative 1 hr Glucola  normal Genetic screening normal Anatomy US normal  No past medical history on file.  Past Surgical History  Procedure Laterality Date  . Wisdom tooth extraction    . Vaginal delivery      2x no complications    OB History  Gravida Para Term Preterm AB SAB TAB Ectopic Multiple Living  0 2    # Outcome Date GA Lbr Len/2nd Weight Sex Delivery Anes PTL Lv  4 Current           3 Term 2012   3.884 kg (8 lb 9 oz) F Vag-Spont   Y  2 Para 2010    F Vag-Spont   Y  1 SAB               Social History   Social History  . Marital Status: Married    Spouse Name: N/A  . Number of Children: N/A  . Years of Education: N/A   Social History Main Topics  . Smoking status: Former Smoker    Quit date: 01/15/2008  . Smokeless tobacco: Never Used  . Alcohol Use: 1.0 oz/week    2 Standard drinks or equivalent per week  . Drug Use: No  . Sexual Activity: Yes    Birth Control/ Protection: None   Other Topics Concern  . Not on file   Social History Narrative   Lives in [place] with husband and 2 daughters 4YO and 2YO (daycare)      Work - full time as Marine scientist.      Diet - regular      Exercise - 3-4x per week, runs 10-88mile/week    Family History  Problem Relation Age of Onset  . Cancer Paternal Grandmother     kidney    Prescriptions prior to admission  Medication  Sig Dispense Refill Last Dose  . Prenatal Vit-Fe Fumarate-FA (PNV PRENATAL PLUS MULTIVITAMIN) 27-1 MG TABS Take 1 tablet daily 30 tablet 6 Taking  . ranitidine (ZANTAC) 150 MG tablet Take 1 tablet (150 mg total) by mouth 2 (two) times daily. 60 tablet 4 Taking    Allergies  Allergen Reactions  . Neosporin [Neomycin-Bacitracin Zn-Polymyx] Rash    Review of Systems: Negative except for what is mentioned in HPI.  Physical Exam: BP 123/80 mmHg  Pulse 97  Temp(Src) 98.5 F (36.9 C) (Oral)  Resp 20  LMP 02/11/2014 (LMP Unknown) GENERAL: Well-developed, well-nourished female in no acute distress.  LUNGS: Clear to auscultation bilaterally.  HEART: Regular rate and rhythm. ABDOMEN: Soft, nontender, nondistended, gravid. EXTREMITIES: Nontender, no edema, 2+ distal pulses. Cervical Exam:  4/80/-2 in office FHT:  Baseline rate 135 bpm   Variability moderate  Accelerations present   Decelerations none Contractions: Every 6 mins   Pertinent Labs/Studies:   Results for orders placed or performed in visit on 11/21/14 (from the past 24 hour(s))  POCT urinalysis dipstick     Status:  None   Collection Time: 11/21/14  2:22 PM  Result Value Ref Range   Color, UA pale yellow    Clarity, UA clear    Glucose, UA neg    Bilirubin, UA neg    Ketones, UA neg    Spec Grav, UA 1.010    Blood, UA neg    pH, UA 6.0    Protein, UA neg    Urobilinogen, UA 0.2    Nitrite, UA neg    Leukocytes, UA Negative Negative    Assessment : Renee Fleming is a 29 y.o. Z6X0960 at [redacted]w[redacted]d being admitted for labor.  Plan: Labor: Expectant management.  Induction/Augmentation as needed, per protocol FWB: Reassuring fetal heart tracing.  GBS negative Delivery plan: Hopeful for vaginal delivery  Silvana Holecek Ines Bloomer, CNM [facility], [facility]

## 2014-11-22 NOTE — Progress Notes (Signed)
Renee Fleming is a 29 y.o. Z6X0960 at [redacted]w[redacted]d by LMP admitted for active labor  Subjective:   Objective: BP 123/80 mmHg  Pulse 97  Temp(Src) 98.5 F (36.9 C) (Oral)  Resp 20  Ht  (1.549 m)  Wt 71.215 kg (157 lb)  BMI 29.68 kg/m2  LMP 02/11/2014 (LMP Unknown)      FHT:  FHR: 150 bpm, variability: moderate,  accelerations:  Present,  decelerations:  Absent UC:   regular, every 4-6 minutes SVE:   Dilation: 5 Exam by:: mnb  Labs: Lab Results  Component Value Date   WBC 12.7* 11/22/2014   HGB 11.6* 11/22/2014   HCT 34.8* 11/22/2014   MCV 85.0 11/22/2014   PLT 163 11/22/2014    Assessment / Plan: Augmentation of labor, progressing well  Labor: Progressing normally Preeclampsia:  labs stable Fetal Wellbeing:  Category I Pain Control:  Labor support without medications I/D:  n/a Anticipated MOD:  NSVD  [name] 11/22/2014, 2:41 PM

## 2014-11-23 LAB — RPR: RPR: NONREACTIVE

## 2014-11-23 LAB — CBC
HEMATOCRIT: 29.1 % — AB (ref 35.0–47.0)
Hemoglobin: 9.6 g/dL — ABNORMAL LOW (ref 12.0–16.0)
MCH: 28.3 pg (ref 26.0–34.0)
MCHC: 33.2 g/dL (ref 32.0–36.0)
MCV: 85.5 fL (ref 80.0–100.0)
PLATELETS: 126 10*3/uL — AB (ref 150–440)
RBC: 3.4 MIL/uL — AB (ref 3.80–5.20)
RDW: 12.8 % (ref 11.5–14.5)
WBC: 11.2 10*3/uL — AB (ref 3.6–11.0)

## 2014-11-23 MED ORDER — DOCUSATE SODIUM 100 MG PO CAPS: 100.0000 mg | ORAL_CAPSULE | Freq: Two times a day (BID) | ORAL | Status: DC | PRN

## 2014-11-23 MED ORDER — FE FUMARATE-B12-VIT C-FA-IFC PO CAPS
1.0000 | ORAL_CAPSULE | Freq: Three times a day (TID) | ORAL | Status: DC
  Administered 2014-11-23 – 2014-11-24 (×2): 1 via ORAL
  Filled 2014-11-23 (×7): qty 1

## 2014-11-23 NOTE — Progress Notes (Signed)
Post Partum Day 1 Subjective: no complaints, up ad lib and voiding  Objective: Blood pressure 103/67, pulse 79, temperature 97.9 F (36.6 C), temperature source Oral, resp. rate 20, height  (1.549 m), weight 157 lb (71.215 kg), last menstrual period 02/11/2014, SpO2 100 %, unknown if currently breastfeeding.  Physical Exam:  General: alert, cooperative and appears stated age Lochia: appropriate Uterine Fundus: firm Incision: NA DVT Evaluation: No evidence of DVT seen on physical exam. Negative Homan's sign.   Recent Labs  11/22/14 1006 11/23/14 0648  HGB 11.6* 9.6*  HCT 34.8* 29.1*    Assessment/Plan: Plan for discharge tomorrow and Breastfeeding  Anemic- add fusion Plus daily with colace   LOS: 1 day   Renee Fleming 11/23/2014, 1:33 PM

## 2014-11-23 NOTE — Lactation Note (Signed)
This note was copied from the chart of [name]. Lactation Consultation Note  Patient Name: [name] [id number] Date: 11/23/2014     Maternal Data   Mother declined assistance  Feeding    LATCH Score/Interventions                      Lactation Tools Discussed/Used     Consult Status      Trudee Grip 11/23/2014, 3:10 PM

## 2014-11-24 MED ORDER — FUSION PLUS PO CAPS: 1.0000 | ORAL_CAPSULE | Freq: Every day | ORAL | Status: DC

## 2014-11-24 MED ORDER — NORETHINDRONE 0.35 MG PO TABS: 1.0000 | ORAL_TABLET | Freq: Every day | ORAL | Status: DC

## 2014-11-24 MED ORDER — DOCUSATE SODIUM 100 MG PO CAPS: 100.0000 mg | ORAL_CAPSULE | Freq: Two times a day (BID) | ORAL | Status: DC | PRN

## 2014-11-24 MED ORDER — VITAMIN D 50 MCG (2000 UT) PO TABS: 2000.0000 [IU] | ORAL_TABLET | Freq: Every day | ORAL | Status: DC

## 2014-11-24 NOTE — Progress Notes (Signed)
Patient understands all discharge instructions and the need to make follow up appointments. Patient discharge via wheelchair with auxillary. 

## 2014-11-24 NOTE — Discharge Summary (Signed)
Obstetric Discharge Summary Reason for Admission: onset of labor Prenatal Procedures: NST and ultrasound Intrapartum Procedures: spontaneous vaginal delivery Postpartum Procedures: none Complications-Operative and Postpartum: none HEMOGLOBIN  Date Value Ref Range Status  11/23/2014 9.6* 12.0 - 16.0 g/dL Final  16/12/9602 54.0 g/dL Final   HCT  Date Value Ref Range Status  11/23/2014 29.1* 35.0 - 47.0 % Final  03/21/2014 37 % Final   HEMATOCRIT  Date Value Ref Range Status  08/24/2014 32.1* 34.0 - 46.6 % Final    Physical Exam:  General: alert, cooperative and appears stated age Lochia: appropriate Uterine Fundus: firm Incision: NA DVT Evaluation: No evidence of DVT seen on physical exam. Negative Homan's sign.  Discharge Diagnoses: Term Pregnancy-delivered  Discharge Information: Date: 11/24/2014 Activity: pelvic rest Diet: routine Medications: PNV, Colace, Iron and vitamin D and Camila Condition: stable Instructions: refer to practice specific booklet Discharge to: home   Newborn Data: Live born female ([place]) Birth Weight: 7 lb 9.3 oz (3440 g) APGAR:9 ,9   Home with mother.  Renee Fleming 11/24/2014, 8:44 AM   I have reviewed the record and concur with patient management and plan. DEFRANCESCO, Daphine Deutscher, MD, [facility]

## 2015-01-03 ENCOUNTER — Ambulatory Visit (INDEPENDENT_AMBULATORY_CARE_PROVIDER_SITE_OTHER): Payer: BLUE CROSS/BLUE SHIELD | Admitting: Obstetrics and Gynecology

## 2015-01-03 ENCOUNTER — Encounter: Payer: Self-pay | Admitting: Obstetrics and Gynecology

## 2015-01-03 DIAGNOSIS — M6208 Separation of muscle (nontraumatic), other site: Secondary | ICD-10-CM

## 2015-01-03 NOTE — Progress Notes (Signed)
  Subjective:     Renee Fleming is a 29 y.o. female who presents for a postpartum visit. She is 6 weeks postpartum following a spontaneous vaginal delivery. I have fully reviewed the prenatal and intrapartum course. The delivery was at 39 gestational weeks. Outcome: spontaneous vaginal delivery. Anesthesia: none. Postpartum course has been uneventful. Baby's course has been uneventful. Baby is feeding by breast. Bleeding brown. Bowel function is normal. Bladder function is normal. Patient is not sexually active. Contraception method is abstinence. Postpartum depression screening: negative.  The following portions of the patient's history were reviewed and updated as appropriate: allergies, current medications, past family history, past medical history, past social history, past surgical history and problem list.  Review of Systems A comprehensive review of systems was negative.   Objective:    BP 116/72 mmHg  Pulse 76  Ht 5\' 2"  (1.575 m)  Wt 140 lb (63.504 kg)  BMI 25.60 kg/m2  Breastfeeding? Yes  General:  alert, cooperative and appears stated age   Breasts:  inspection negative, no nipple discharge or bleeding, no masses or nodularity palpable  Lungs: clear to auscultation bilaterally  Heart:  regular rate and rhythm, S1, S2 normal, no murmur, click, rub or gallop  Abdomen: abnormal findings:  diastasis recti   Vulva:  normal  Vagina: normal vagina, no discharge, exudate, lesion, or erythema  Cervix:  multiparous appearance  Corpus: normal size, contour, position, consistency, mobility, non-tender  Adnexa:  normal adnexa  Rectal Exam: Not performed.        Assessment:     6 week postpartum exam. Pap smear not done at today's visit.   Diastasis Recti- 3FB  Plan:    1. Contraception: oral progesterone-only contraceptive 2. PT referral for treatment of Diastasis recti- may need surgery in future 3. Follow up in: 4 months or as needed.

## 2015-01-03 NOTE — Patient Instructions (Addendum)
Place postpartum visit patient instructions here. Diastasis recti  From Wikipedia, the free encyclopedia  Jump to: navigation, search  Diastasis recti   Diastasis recti in an infant  Classification and external resources  Specialty Pediatrics  ICD-10 M62.0  ICD-9-CM 728.84  MedlinePlus [id number]  [edit on Wikidata]  Diastasis recti (also known as abdominal separation) is commonly defined as a gap of roughly 2.7 cm or greater between the two sides of the rectus abdominis muscle.[1] This condition has no associated morbidity or mortality.[2] The distance between the right and left rectus abdominis muscles is created by the stretching of the linea alba, a connective collagen sheath created by the aponeurosis insertions of the transverse abdominis, internal oblique, and external oblique.[3] Diastasis of this muscle occurs principally in two populations: newborns and pregnant women. It is also known to occur in men. In the newborn, the rectus abdominis is not fully developed and may not be sealed together at midline. Diastasis recti is more common in premature and black newborns.  In pregnant or postpartum women, the condition is caused by the stretching of the rectus abdominis by the growing uterus. It is more common in multiparous women due to repeated episodes of stretching. When the defect occurs during pregnancy, the uterus can sometimes be seen bulging through the abdominal wall beneath the skin.  Women are more susceptible to develop diastasis recti when over the age of 56, high birth weight of child, multiple birth pregnancy, and multiple pregnancies. Additional causes can be attributed to excessive abdominal exercises after the first trimester of pregnancy.[4] Contents [hide]  1 Presentation  2 Treatment  2.1 Physiotherapy  2.1.1 Exercises 2.2 Surgical 3 References Presentation[edit] A diastasis recti may appear as a ridge running down the midline of the abdomen, anywhere from the  xiphoid process to the umbilicus. It becomes more prominent with straining and may disappear when the abdominal muscles are relaxed. The medial borders of the right and left halves of the muscle may be palpated during contraction of the rectus abdominis.[5] The condition can be diagnosed by physical exam, and must be differentiated from an epigastric hernia or incisional hernia, if the patient has had abdominal surgery.[2] Hernias may be ruled out using ultrasound. In infants, they typically result from a minor defect of the linea alba between the rectus abdominis muscles. This allows tissue from inside the abdomen to herniate anteriorly. On infants, this may manifest as an apparent 'bubble' under the skin of the belly between the umbilicus and xiphisternum (bottom of the breastbone). Examination is performed with the subject lying on their back, knees bent at 90 with feet flat, head slightly lifted placing chin on chest. With muscles tense, examiners then place fingers in the ridge that is presented. Measurement of the width of separation is determined by the number of fingertips that can fit within the space between the left and right rectus abdominis muscles. Separation consisting of a width of 2 fingertips (approximately 1 1/2 centimeters) or more is the determining factor for diagnosing diastasis recti.[6] Treatment[edit] No treatment is necessary for women while they are still pregnant. In children, complications include development of an umbilical or ventral hernia, which is rare and can be corrected with surgery.[7] Alerting a medical professional is important when an infant displays signs of vomiting, redness or pain in the abdominal area. Typically the separation of the abdominal muscles will lessen within the first 8 weeks after childbirth, however the connective tissue remains stretched for many postpartum women. The weakening of the  abdominal muscles and the reduced force transmission from the  stretched linea alba may also make it difficult to lift objects, and cause lower back pain. Additional complications can manifest in weakened pelvic alignment and altered posture.[6] Physiotherapy[edit] A Systematic Review of the evidence found that exercise may or may not reduce the size of the gap in pregnant or postpartum women. The authors looked at 8 studies totaling 336 women and concluded, "Due to the low number and quality of included articles, there is insufficient evidence to recommend that exercise may help to prevent or reduce DRAM" also stating that "non-specific exercise may or may not help to prevent or reduce DRAM during the ante- and postnatal periods."[1] Exercises[edit] Nevertheless, the following exercises are often recommended to help build abdominal strength, which may or may not help reduce the size of diastasis recti[8] Core contraction - In a seated position, place both hands on abdominal muscles. Take small controlled breaths. Slowly contract the abdominal muscles, pulling them straight back towards the spine. Hold the contraction for 30 seconds, while maintaining the controlled breathing. Complete 10 repetitions.[8]  Seated squeeze - Again in a seated position, place one hand above the belly button, and the other below the belly button. With controlled breaths, with a mid-way starting point, pull the abdominals back toward the spine, hold for 2 seconds and return to the mid-way point. Complete 100 repetitions.[8]  Head lift - In a lying down position, knees bent at 90 angle, feet flat, slowly lift the head, chin toward your chest, (concentrate on isolation of the abdominals to prevent hip-flexors from being engaged),[6] slowly contract abdominals toward floor, hold for two seconds, lower head to starting position for 2 seconds. Complete 10 repetitions.[8]  Upright push-up - A standup pushup against the wall, with feet together arms-length away from wall, place hands flat against  the wall, contract abdominal muscles toward spine, lean body towards wall, with elbows bent downward close to body, pull abdominal muscles in further, with controlled breathing. Release muscles as you push back to starting position. Complete 20 repetitions.[8]  Squat against the wall - Also known as a seated squat, stand with back against the wall, feet out in front of body, slowly lower body to a seated position so knees are bent at a 90 angle, contracting abs toward spine as you raise body back to standing position. Optionally, this exercise can also be done using an exercise ball placed against the wall and your lower back. Complete 20 Repetitions.[8]  Squat with squeeze - A variation to the "Squat against the wall" is to place a small resistance ball between the knees, and squeeze the ball as you lower your body to the seated position. Complete 20 repetitions.[8] It is also noted that incorrect exercises, including crunches can actually increase the distasis recti separation. All corrective exercises should be in the form of pulling in of the abdominal muscles rather than a pushing of them outwards. Consultation of a professional physiotherapist is recommended for correct exercise routines.[8] In addition to the above exercises, the [facility] study concluded the "quadruped" position yielded the most effective results.[6] A quadruped position is defined as "a human whose body weight is supported by both arms as well as both legs".[9] In this position, the subject would start with a flat back, then slowly tilt the head down, and arch the back, contracting the abdominal muscles towards the spine, holding this position for 5 seconds, then releasing back to starting position. Complete 2 sets of 10  repetitions.[6] Surgical[edit] In extreme cases, diastasis recti is corrected during the cosmetic surgery procedure known as an abdominoplasty by creating a plication or folding of the linea alba and suturing  together. This creates a tighter abdominal wall. In adult females, a laparoscopic [place] technique can be used for plication of the recti.[10]

## 2015-01-04 ENCOUNTER — Telehealth: Payer: Self-pay | Admitting: *Deleted

## 2015-01-04 ENCOUNTER — Ambulatory Visit: Payer: BLUE CROSS/BLUE SHIELD | Admitting: Obstetrics and Gynecology

## 2015-01-04 LAB — CBC
HEMATOCRIT: 39.4 % (ref 34.0–46.6)
Hemoglobin: 13.2 g/dL (ref 11.1–15.9)
MCH: 28 pg (ref 26.6–33.0)
MCHC: 33.5 g/dL (ref 31.5–35.7)
MCV: 84 fL (ref 79–97)
PLATELETS: 247 10*3/uL (ref 150–379)
RBC: 4.72 x10E6/uL (ref 3.77–5.28)
RDW: 13.4 % (ref 12.3–15.4)
WBC: 4.9 10*3/uL (ref 3.4–10.8)

## 2015-01-04 LAB — FERRITIN: Ferritin: 42 ng/mL (ref 15–150)

## 2015-01-04 LAB — VITAMIN D 25 HYDROXY (VIT D DEFICIENCY, FRACTURES): VIT D 25 HYDROXY: 39.3 ng/mL (ref 30.0–100.0)

## 2015-01-04 NOTE — Telephone Encounter (Signed)
Left detailed message for pt 

## 2015-01-04 NOTE — Telephone Encounter (Signed)
-----   Message from Ulyses AmorMelody N Burr, [place] sent at 01/04/2015 10:26 AM EDT ----- Please let her know all labs are normal,

## 2015-01-10 ENCOUNTER — Ambulatory Visit: Payer: BLUE CROSS/BLUE SHIELD | Attending: Obstetrics and Gynecology | Admitting: Physical Therapy

## 2015-01-10 DIAGNOSIS — Z7409 Other reduced mobility: Secondary | ICD-10-CM

## 2015-01-10 DIAGNOSIS — R279 Unspecified lack of coordination: Secondary | ICD-10-CM | POA: Diagnosis present

## 2015-01-10 DIAGNOSIS — M629 Disorder of muscle, unspecified: Secondary | ICD-10-CM

## 2015-01-10 NOTE — Patient Instructions (Addendum)
Sleep with pilow under knees and scoot hips under to lengthen your back    You are now ready to begin training the deep core muscles system: diaphragm, transverse abdominis, pelvic floor . These muscles must work together as a team.           The key to these exercises to train the brain to coordinate the timing of these muscles and to have them turn on for long periods of time to hold you upright against gravity (especially important if you are on your feet all day).These muscles are postural muscles and play a role stabilizing your spine and bodyweight. By doing these repetitions slowly and correctly instead of doing crunches, you will achieve a flatter belly without a lower pooch. You are also placing your spine in a more neutral position and breathing properly which in turn, decreases your risk for problems related to your pelvic floor, abdominal, and low back such as pelvic organ prolapse, hernias, diastasis recti (separation of superficial muscles), disk herniations, spinal fractures. These exercises set a solid foundation for you to later progress to resistance/ strength training with therabands and weights and return to other typical fitness exercises with a stronger deeper core.    DO ONLY LEVEL ONE THIS WEEK                                                 Preserve the function of your pelvic floor, abdomen, and back.              Avoid decreased straining of abdominal/pelvic floor muscles with less              slouching,  holding your breath with lifting/bowel movements)                                                     FUNCTIONAL POSTURES

## 2015-01-11 NOTE — Therapy (Signed)
[facility] [address] [place], [place], [address] Phone: [phone]   Fax:  [phone]  Physical Therapy Evaluation  Patient Details  Name: Renee Fleming MRN: [id number] Date of Birth: 1985-03-12 Referring Provider: Ines BloomerBurr  Encounter Date: 01/10/2015      PT End of Session - 01/11/15 2218    Visit Number 1   Number of Visits 12   Date for PT Re-Evaluation 03/21/15   PT Start Time 1420   PT Stop Time 1530   PT Time Calculation (min) 70 min   Activity Tolerance Patient tolerated treatment well;No increased pain   Behavior During Therapy [facility] for tasks assessed/performed      No past medical history on file.  Past Surgical History  Procedure Laterality Date  . Wisdom tooth extraction    . Vaginal delivery      2x no complications    There were no vitals filed for this visit.  Visit Diagnosis:  Fascial defect - Plan: PT plan of care cert/re-cert  Lack of coordination - Plan: PT plan of care cert/re-cert  Mobility impaired - Plan: PT plan of care cert/re-cert          [facility] - 01/11/15 2207    Assessment   Medical Diagnosis DRA    Referring Provider Ines BloomerBurr   Precautions   Precautions None   Restrictions   Weight Bearing Restrictions No   Balance Screen   Has the patient fallen in the past 6 months No   Prior Function   Level of Independence Independent   Observation/Other Assessments   Other Surveys  --  PSFS ex:0, pick heavy objects 3, sleeping on back: 4. PGQ52%   Coordination   Gross Motor Movements are Fluid and Coordinated --  ankles under seat, slouch sitting   Fine Motor Movements are Fluid and Coordinated --  abdominal straining with cue for bowel movement   Coordination and Movement Description abdominal straining with exhalation   Sit to Stand   Comments breathholding, able to coordinate exhale and reported less LBP    Other:   Other/ Comments lifting: spinal forward  bend narrow BOS, able to demo squat but will require more alignment cuing at future sessions, able ot coordinate lifting with exhale.  Holding 10#    Posture/Postural Control   Postural Limitations --   holding baby with hips swayed to side,cued for alignment   Posture Comments holding 10# in stance for 5min with report of LBP, lumbopelvic instability with ASLR   post-Tx, no pain   Palpation   Spinal mobility increased mm tensions and hypomobility along thoracic region   Bed Mobility   Bed Mobility --  crunch method, able to log roll w. cues                 Pelvic Floor Special Questions - 01/11/15 2216    Diastasis Recti 3 fingers below sternum, 4 fingers above umbilicus, and 2 fingers below umbilicus, noted wrinkling texture around umbilicus   post-Tx: 2 fingers          [facility] Adult PT Treatment/Exercise - 01/11/15 2207    Neuro Re-ed    Neuro Re-ed Details  see pt instructions   Manual Therapy   Joint Mobilization thoracic releases Grade II-III    Soft tissue mobilization STM along paraspinals   Myofascial Release quadriped, abdominal pulling with UE movements  PT Education - 01/11/15 2216    Education provided Yes   Education Details HEP, POC, goals, anatomy and physiology, ways to decrease pelvic floor and abdominal straining   Person(s) Educated Patient   Methods Explanation;Demonstration;Tactile cues;Verbal cues;Handout   Comprehension Verbalized understanding;Returned demonstration             PT Long Term Goals - 01/11/15 2224    PT LONG TERM GOAL #1   Title Pt will increase her PSFS score with  exercise:0 to > 5 , pick heavy objects 3 to  > 5 , sleeping on back: 4 to > 8 in order to return to ADLs.   Time 12   Period Weeks   Status New   PT LONG TERM GOAL #2   Title Pt will decrease her PGQ score from 52% to < 40% in order to lift her baby.   Time 12   Period Weeks   Status New   PT LONG TERM GOAL #3   Title Pt will  demo decreased abdominal separation to < 2 fingers width and demo no lumbooelvic instbaility with ALSR and dynamic stabilization level 1-4 with 10 reps each in order to be able to return to running.   Time 12   Period Weeks   Status New               Plan - 01/11/15 2235    Clinical Impression Statement  Pt is a 29 yo female whose S & Sx consist ?of DRA, movement and habits that strains abdominal and pelvic floor ?muscles, poor deep core strength and coordination, spinal and mm ?imbalances, and poor posture and body mechanics.These deficits impact her ?ability to care for her children and sleeping. Pt tolerated manual Tx ?today and showed a decrease in from 3 fingers width to 2 finger's width of ?abdominal separation post-Tx with improved deep core coordination.   Pt will benefit from skilled therapeutic intervention in order to improve on the following deficits Abnormal gait;Decreased balance;Difficulty walking;Decreased safety awareness;Decreased activity tolerance;Decreased mobility;Decreased scar mobility;Increased muscle spasms;Postural dysfunction;Pain;Improper body mechanics;Increased fascial restricitons;Hypomobility;Decreased strength;Decreased endurance;Decreased range of motion;Decreased coordination;Impaired flexibility   Rehab Potential Good   PT Frequency 1x / week   PT Duration 12 weeks   PT Treatment/Interventions ADLs/Self Care Home Management;Electrical Stimulation;Cryotherapy;Biofeedback;Aquatic Therapy;Moist Heat;Gait training;Traction;Stair training;Patient/family education;Orthotic Fit/Training;Functional mobility training;Therapeutic activities;Therapeutic exercise;Balance training;Neuromuscular re-education;Manual techniques;Scar mobilization;Passive range of motion;Dry needling;Energy conservation;Splinting;Taping   Consulted and Agree with Plan of Care Patient         Problem List Patient Active Problem List   Diagnosis Date Noted  . Vaginal delivery  11/22/2014  . Gastroenteritis, acute 09/22/2014  . Diarrhea 09/22/2014  . Pregnancy 03/16/2014  . Routine general medical examination at a health care facility 02/09/2014  . Menorrhagia with regular cycle 02/09/2014  . Adjustment disorder with mixed anxiety and depressed mood 02/09/2014  . Insomnia 01/14/2013  . Ventral hernia 01/14/2013    Mariane Masters ,PT, DPT, E-RYT  01/11/2015, 10:36 PM  [place] [facility] MAIN [facility] SERVICES [address] [place], [place], [address] Phone: [phone]   Fax:  [phone]  Name: Renee Fleming MRN: [id number] Date of Birth: Jan 08, 1986

## 2015-01-18 ENCOUNTER — Telehealth: Payer: Self-pay | Admitting: Obstetrics and Gynecology

## 2015-01-18 ENCOUNTER — Ambulatory Visit: Payer: BLUE CROSS/BLUE SHIELD | Admitting: Physical Therapy

## 2015-01-18 DIAGNOSIS — R279 Unspecified lack of coordination: Secondary | ICD-10-CM

## 2015-01-18 DIAGNOSIS — Z7409 Other reduced mobility: Secondary | ICD-10-CM

## 2015-01-18 DIAGNOSIS — M629 Disorder of muscle, unspecified: Secondary | ICD-10-CM

## 2015-01-18 NOTE — Patient Instructions (Addendum)
Wear baby wrap instead of carrying baby in carseat to upstairs apt   Use foot stool to avoid leaning forward when bathing her   Standing: realign ribcage over pelvis :   Not holding breath, not sucking belly in, expand ribcage with inhale, exhale belly hugs and pelvic floor lifts   Climbing stairs and walking, inhale and exhale throughout activity   Carrying car seat with both hands     Stretching back:   Child s pose rocking    3- way childs pose on stroller    Open book (handout )     Strengthening:  Dynamic Level 2  30 reps, 2x day

## 2015-01-18 NOTE — Telephone Encounter (Signed)
Pt had bleeding for 6 wks, then stopped for 4-5 days and then she had some brown discharge, last night red blood. Not a big quantity. Please call to advise.

## 2015-01-18 NOTE — Therapy (Signed)
[facility] [address] [place], [place], [address] Phone: [phone]   Fax:  [phone]  Physical Therapy Treatment  Patient Details  Name: Renee Sickleslyson Treadway MRN: [id number] Date of Birth: Aug 05, 1985 Referring Provider: Ines BloomerBurr  Encounter Date: 01/18/2015      PT End of Session - 01/18/15 1225    Visit Number 2   Number of Visits 12   Date for PT Re-Evaluation 03/21/15   PT Start Time 1120   PT Stop Time 1220   PT Time Calculation (min) 60 min   Activity Tolerance Patient tolerated treatment well;No increased pain   Behavior During Therapy [facility] for tasks assessed/performed      No past medical history on file.  Past Surgical History  Procedure Laterality Date  . Wisdom tooth extraction    . Vaginal delivery      2x no complications    There were no vitals filed for this visit.  Visit Diagnosis:  Fascial defect  Lack of coordination  Mobility impaired      Subjective Assessment - 01/18/15 1127    Subjective Pt has been more mindful with exhaling with lifting and log rolling and sitting upright. Pt has low back pain today after carrying baby car seat with baby up the stairs with additional bags this weekend with a pain at 7/10 and today 3/10.    Pertinent History running routine with hills 3-4x week, 30-60 min.    Patient Stated Goals " get stomach muscles back to where they used to be and get  back to quit hurting "   Currently in Pain? Yes   Pain Score 3    Pain Location Back            [facility] PT Assessment - 01/18/15 1132    Observation/Other Assessments   Skin Integrity wrinkly area surrounding umbilicus 4.5" diameter, 6.5 in length    ROM / Strength   AROM / PROM / Strength --   AROM   Overall AROM Comments pain: flexion limited to 80 deg, sidebend bilaterally, ext , pore-Tx, post -Tx no pain with sidebend bilaterally with increased ROM    Palpation   Palpation comment L4 TP and L5/S , pre-Tx                  Pelvic Floor Special Questions - 01/18/15 1224    Diastasis Recti 2.5 fingers below sternum, 2 fingers above and < 1 finger below umbilicus            [facility] Adult PT Treatment/Exercise - 01/18/15 1132    Ambulation/Gait   Gait velocity 1.16 m/s  pre-Tx,, Post-Tx 1.39 sec   Gait Comments limited pelvic rotation, antalgic   increased arm swing, decreased pain    Therapeutic Activites    Other Therapeutic Activities see pt instructins   Neuro Re-ed    Neuro Re-ed Details  standing posture and alignment and decreased breathholding and sucking belly in, and education on mechanics    Manual Therapy   Soft tissue mobilization MWM open book, paraspinals and ribcage to faciliate ribcage depression    Myofascial Release quadriped, abdominal pulling with UE movements  abdominal massage                 PT Education - 01/18/15 1225    Education provided Yes   Education Details HEP   Person(s) Educated Patient   Methods Explanation;Demonstration;Tactile cues;Verbal cues;Handout   Comprehension Verbalized understanding;Returned demonstration  PT Long Term Goals - 01/11/15 2224    PT LONG TERM GOAL #1   Title Pt will increase her PSFS score with  exercise:0 to > 5 , pick heavy objects 3 to  > 5 , sleeping on back: 4 to > 8 in order to return to ADLs.   Time 12   Period Weeks   Status New   PT LONG TERM GOAL #2   Title Pt will decrease her PGQ score from 52% to < 40% in order to lift her baby.   Time 12   Period Weeks   Status New   PT LONG TERM GOAL #3   Title Pt will demo decreased abdominal separation to < 2 fingers width and demo no lumbooelvic instbaility with ALSR and dynamic stabilization level 1-4 with 10 reps each in order to be able to return to running.   Time 12   Period Weeks   Status New               Plan - 01/18/15 1226    Clinical Impression Statement Pt's reported LBP improved to no pain after manual Tx,  ther ex, and neuro-reeducation with demonstrated of improved gait and spinal ROM.  Pt's DRA improved with decreasd fingers width but will continue to need manual and strengthening treatments. Pt received heavy emphasis on activity modification with mothering duties in order  to decrease load on spine. Pt demo'd understanding of these recommendations.    Pt will benefit from skilled therapeutic intervention in order to improve on the following deficits Abnormal gait;Decreased balance;Difficulty walking;Decreased safety awareness;Decreased activity tolerance;Decreased mobility;Decreased scar mobility;Increased muscle spasms;Postural dysfunction;Pain;Improper body mechanics;Increased fascial restricitons;Hypomobility;Decreased strength;Decreased endurance;Decreased range of motion;Decreased coordination;Impaired flexibility   Rehab Potential Good   PT Frequency 1x / week   PT Duration 12 weeks   PT Treatment/Interventions ADLs/Self Care Home Management;Electrical Stimulation;Cryotherapy;Biofeedback;Aquatic Therapy;Moist Heat;Gait training;Traction;Stair training;Patient/family education;Orthotic Fit/Training;Functional mobility training;Therapeutic activities;Therapeutic exercise;Balance training;Neuromuscular re-education;Manual techniques;Scar mobilization;Passive range of motion;Dry needling;Energy conservation;Splinting;Taping   Consulted and Agree with Plan of Care Patient        Problem List Patient Active Problem List   Diagnosis Date Noted  . Vaginal delivery 11/22/2014  . Gastroenteritis, acute 09/22/2014  . Diarrhea 09/22/2014  . Pregnancy 03/16/2014  . Routine general medical examination at a health care facility 02/09/2014  . Menorrhagia with regular cycle 02/09/2014  . Adjustment disorder with mixed anxiety and depressed mood 02/09/2014  . Insomnia 01/14/2013  . Ventral hernia 01/14/2013    Mariane Masters ,PT, DPT, E-RYT  01/18/2015, 12:31 PM  [place] [facility] MAIN [facility] SERVICES [address] [place], [place], [address] Phone: [phone]   Fax:  [phone]  Name: Renee Fleming MRN: [id number] Date of Birth: 1986-01-04

## 2015-01-19 NOTE — Telephone Encounter (Signed)
Called pt and advised normal, if she would start oral contraceptives it would help,pt voiced understanding

## 2015-01-26 ENCOUNTER — Ambulatory Visit: Payer: BLUE CROSS/BLUE SHIELD | Admitting: Physical Therapy

## 2015-01-29 ENCOUNTER — Ambulatory Visit: Payer: BLUE CROSS/BLUE SHIELD | Admitting: Physical Therapy

## 2015-01-29 DIAGNOSIS — R279 Unspecified lack of coordination: Secondary | ICD-10-CM

## 2015-01-29 DIAGNOSIS — M629 Disorder of muscle, unspecified: Secondary | ICD-10-CM

## 2015-01-29 DIAGNOSIS — Z7409 Other reduced mobility: Secondary | ICD-10-CM

## 2015-01-29 NOTE — Patient Instructions (Addendum)
   Back stretches:   Open book (in sidelying) with pillow between knees     Pelvic tilts with breaths       Windshield wipers (both knees rocking)   5 breaths        Reclined twist  (scoot hips to L, drop knee onto pillow on Rside, arms spread wide, head turned R)  Visa versa   5 breaths      Standing strengthening exercises:   Pelvic tilt against wall with breathing  (curl tailbone under)   10 reps    Wall slides: exhaling up 10 x 3 sets / day    Dynamic Stabilization   With folded towel under sacrum   Level 1 and 2  Slow and controled   30 reps morning and night

## 2015-01-31 NOTE — Therapy (Signed)
[facility] [address] [place], [place], [address] Phone: [phone]   Fax:  [phone]  Physical Therapy Treatment  Patient Details  Name: Renee Fleming MRN: [id number] Date of Birth: 11-25-1985 Referring Provider: Ines BloomerBurr  Encounter Date: 01/29/2015      PT End of Session - 01/31/15 0811    Visit Number 3   Number of Visits 12   Date for PT Re-Evaluation 03/21/15   PT Start Time 1300   PT Stop Time 1400   PT Time Calculation (min) 60 min   Activity Tolerance Patient tolerated treatment well;No increased pain   Behavior During Therapy [facility] for tasks assessed/performed      No past medical history on file.  Past Surgical History  Procedure Laterality Date  . Wisdom tooth extraction    . Vaginal delivery      2x no complications    There were no vitals filed for this visit.  Visit Diagnosis:  Fascial defect  Lack of coordination  Mobility impaired      Subjective Assessment - 01/31/15 0805    Subjective Pt reported her low back resolved for 3 days over the weekend after last visit. After [place] and driving on her trip, pt;s LBP has returned.    Pertinent History running routine with hills 3-4x week, 30-60 min.    Patient Stated Goals " get stomach muscles back to where they used to be and get  back to quit hurting "   Currently in Pain? Yes   Pain Score 4    Pain Location Back   Pain Orientation Lower            [facility] PT Assessment - 01/31/15 0805    Observation/Other Assessments   Skin Integrity decreased wrinkly skin around umbilicus, mm firmness noted    Palpation   Spinal mobility decreased mm tensions and increasemobility along thoracic segments.  NOted L paraspinal mm tensions > R today                   Pelvic Floor Special Questions - 01/31/15 0806    Diastasis Recti 1.5 fingers below           [facility]ercise - 01/31/15 0807     Self-Care   Self-Care --  positioning with towel roll under sacrum in hooklying    Neuro Re-ed    Neuro Re-ed Details  cues to find pelvic neutral, decrease lumbar lordosis in all positions   tactile cues required   Exercises   Exercises --  see patient instruction s   Manual Therapy   Joint Mobilization inferior glide on sacrum  5 bouts 20 secs   reported relief in back    Soft tissue mobilization stripping, effleurage, sustained pressure along paraspinals                 PT Education - 01/31/15 0811    Education provided Yes   Education Details HEP   Person(s) Educated Patient   Methods Explanation;Tactile cues;Verbal cues;Handout;Demonstration   Comprehension Verbalized understanding;Returned demonstration             PT Long Term Goals - 01/11/15 2224    PT LONG TERM GOAL #1   Title Pt will increase her PSFS score with  exercise:0 to > 5 , pick heavy objects 3 to  > 5 , sleeping on back: 4 to > 8 in order to return to ADLs.  Time 12   Period Weeks   Status New   PT LONG TERM GOAL #2   Title Pt will decrease her PGQ score from 52% to < 40% in order to lift her baby.   Time 12   Period Weeks   Status New   PT LONG TERM GOAL #3   Title Pt will demo decreased abdominal separation to < 2 fingers width and demo no lumbooelvic instbaility with ALSR and dynamic stabilization level 1-4 with 10 reps each in order to be able to return to running.   Time 12   Period Weeks   Status New               Plan - 01/31/15 7846    Clinical Impression Statement Pt required review of correct form with HEP from last session as pt performed them incorrectly and had reported pain with exercises.  Pt reported no pain with correct form. Pt reported decreased back pain from 4/10 to 2/10  post-Tx . Pt 's HEP today  include back stretches to decrease overuse of paraspinals mm,  propioception training to find neutral pelvis and decreased lumbar lordosis, and deep core  strengthening,. Pt continues to progress well with good carry over with decreased abdominal spearation and upright posture,     Pt will benefit from skilled therapeutic intervention in order to improve on the following deficits Abnormal gait;Decreased balance;Difficulty walking;Decreased safety awareness;Decreased activity tolerance;Decreased mobility;Decreased scar mobility;Increased muscle spasms;Postural dysfunction;Pain;Improper body mechanics;Increased fascial restricitons;Hypomobility;Decreased strength;Decreased endurance;Decreased range of motion;Decreased coordination;Impaired flexibility   Rehab Potential Good   PT Frequency 1x / week   PT Duration 12 weeks   PT Treatment/Interventions ADLs/Self Care Home Management;Electrical Stimulation;Cryotherapy;Biofeedback;Aquatic Therapy;Moist Heat;Gait training;Traction;Stair training;Patient/family education;Orthotic Fit/Training;Functional mobility training;Therapeutic activities;Therapeutic exercise;Balance training;Neuromuscular re-education;Manual techniques;Scar mobilization;Passive range of motion;Dry needling;Energy conservation;Splinting;Taping   Consulted and Agree with Plan of Care Patient        Problem List Patient Active Problem List   Diagnosis Date Noted  . Vaginal delivery 11/22/2014  . Gastroenteritis, acute 09/22/2014  . Diarrhea 09/22/2014  . Pregnancy 03/16/2014  . Routine general medical examination at a health care facility 02/09/2014  . Menorrhagia with regular cycle 02/09/2014  . Adjustment disorder with mixed anxiety and depressed mood 02/09/2014  . Insomnia 01/14/2013  . Ventral hernia 01/14/2013    Mariane Masters ,PT, DPT, E-RYT  01/31/2015, 8:15 AM   [facility] MAIN [facility] SERVICES [address] [place], [place], [address] Phone: [phone]   Fax:  [phone]  Name: Renee Fleming MRN: [id number] Date of Birth: [birth year]

## 2015-02-07 ENCOUNTER — Ambulatory Visit: Payer: BLUE CROSS/BLUE SHIELD | Admitting: Physical Therapy

## 2015-02-12 ENCOUNTER — Encounter: Payer: BLUE CROSS/BLUE SHIELD | Admitting: Internal Medicine

## 2015-02-12 ENCOUNTER — Ambulatory Visit: Payer: BLUE CROSS/BLUE SHIELD | Admitting: Physical Therapy

## 2015-02-19 ENCOUNTER — Telehealth: Payer: Self-pay | Admitting: Physical Therapy

## 2015-02-19 NOTE — Telephone Encounter (Signed)
PT left a message to f/u with pt about scheduling for PT and to f/u on her POC.

## 2015-04-02 ENCOUNTER — Encounter: Payer: BLUE CROSS/BLUE SHIELD | Admitting: Internal Medicine

## 2015-04-26 ENCOUNTER — Ambulatory Visit (INDEPENDENT_AMBULATORY_CARE_PROVIDER_SITE_OTHER): Payer: BLUE CROSS/BLUE SHIELD | Admitting: Internal Medicine

## 2015-04-26 ENCOUNTER — Encounter: Payer: Self-pay | Admitting: Internal Medicine

## 2015-04-26 VITALS — BP 108/75 | HR 63 | Temp 98.2°F | Ht 63.5 in | Wt 147.0 lb

## 2015-04-26 DIAGNOSIS — Z0000 Encounter for general adult medical examination without abnormal findings: Secondary | ICD-10-CM

## 2015-04-26 NOTE — Assessment & Plan Note (Signed)
General medical exam normal today. PAP and pelvic and breast exam deferred as completed by OB. Reviewed labs from 12/2014. Will hold off on additional labs for now. Immunizations are UTD. Encouraged healthy diet and exercise. Follow up 1 year and prn

## 2015-04-26 NOTE — Progress Notes (Signed)
Pre visit review using our clinic review tool, if applicable. No additional management support is needed unless otherwise documented below in the visit note. 

## 2015-04-26 NOTE — Patient Instructions (Signed)
Health Maintenance, Female Adopting a healthy lifestyle and getting preventive care can go a long way to promote health and wellness. Talk with your health care provider about what schedule of regular examinations is right for you. This is a good chance for you to check in with your provider about disease prevention and staying healthy. In between checkups, there are plenty of things you can do on your own. Experts have done a lot of research about which lifestyle changes and preventive measures are most likely to keep you healthy. Ask your health care provider for more information. WEIGHT AND DIET  Eat a healthy diet  Be sure to include plenty of vegetables, fruits, low-fat dairy products, and lean protein.  Do not eat a lot of foods high in solid fats, added sugars, or salt.  Get regular exercise. This is one of the most important things you can do for your health.  Most adults should exercise for at least 150 minutes each week. The exercise should increase your heart rate and make you sweat (moderate-intensity exercise).  Most adults should also do strengthening exercises at least twice a week. This is in addition to the moderate-intensity exercise.  Maintain a healthy weight  Body mass index (BMI) is a measurement that can be used to identify possible weight problems. It estimates body fat based on height and weight. Your health care provider can help determine your BMI and help you achieve or maintain a healthy weight.  For females 20 years of age and older:   A BMI below 18.5 is considered underweight.  A BMI of 18.5 to 24.9 is normal.  A BMI of 25 to 29.9 is considered overweight.  A BMI of 30 and above is considered obese.  Watch levels of cholesterol and blood lipids  You should start having your blood tested for lipids and cholesterol at 32 years of age, then have this test every 5 years.  You may need to have your cholesterol levels checked more often if:  Your lipid  or cholesterol levels are high.  You are older than 32 years of age.  You are at high risk for heart disease.  CANCER SCREENING   Lung Cancer  Lung cancer screening is recommended for adults 55-80 years old who are at high risk for lung cancer because of a history of smoking.  A yearly low-dose CT scan of the lungs is recommended for people who:  Currently smoke.  Have quit within the past 15 years.  Have at least a 30-pack-year history of smoking. A pack year is smoking an average of one pack of cigarettes a day for 1 year.  Yearly screening should continue until it has been 15 years since you quit.  Yearly screening should stop if you develop a health problem that would prevent you from having lung cancer treatment.  Breast Cancer  Practice breast self-awareness. This means understanding how your breasts normally appear and feel.  It also means doing regular breast self-exams. Let your health care provider know about any changes, no matter how small.  If you are in your 20s or 30s, you should have a clinical breast exam (CBE) by a health care provider every 1-3 years as part of a regular health exam.  If you are 40 or older, have a CBE every year. Also consider having a breast X-ray (mammogram) every year.  If you have a family history of breast cancer, talk to your health care provider about genetic screening.  If you   are at high risk for breast cancer, talk to your health care provider about having an MRI and a mammogram every year.  Breast cancer gene (BRCA) assessment is recommended for women who have family members with BRCA-related cancers. BRCA-related cancers include:  Breast.  Ovarian.  Tubal.  Peritoneal cancers.  Results of the assessment will determine the need for genetic counseling and BRCA1 and BRCA2 testing. Cervical Cancer Your health care provider may recommend that you be screened regularly for cancer of the pelvic organs (ovaries, uterus, and  vagina). This screening involves a pelvic examination, including checking for microscopic changes to the surface of your cervix (Pap test). You may be encouraged to have this screening done every 3 years, beginning at age 21.  For women ages 30-65, health care providers may recommend pelvic exams and Pap testing every 3 years, or they may recommend the Pap and pelvic exam, combined with testing for human papilloma virus (HPV), every 5 years. Some types of HPV increase your risk of cervical cancer. Testing for HPV may also be done on women of any age with unclear Pap test results.  Other health care providers may not recommend any screening for nonpregnant women who are considered low risk for pelvic cancer and who do not have symptoms. Ask your health care provider if a screening pelvic exam is right for you.  If you have had past treatment for cervical cancer or a condition that could lead to cancer, you need Pap tests and screening for cancer for at least 20 years after your treatment. If Pap tests have been discontinued, your risk factors (such as having a new sexual partner) need to be reassessed to determine if screening should resume. Some women have medical problems that increase the chance of getting cervical cancer. In these cases, your health care provider may recommend more frequent screening and Pap tests. Colorectal Cancer  This type of cancer can be detected and often prevented.  Routine colorectal cancer screening usually begins at 32 years of age and continues through 32 years of age.  Your health care provider may recommend screening at an earlier age if you have risk factors for colon cancer.  Your health care provider may also recommend using home test kits to check for hidden blood in the stool.  A small camera at the end of a tube can be used to examine your colon directly (sigmoidoscopy or colonoscopy). This is done to check for the earliest forms of colorectal  cancer.  Routine screening usually begins at age 50.  Direct examination of the colon should be repeated every 5-10 years through 32 years of age. However, you may need to be screened more often if early forms of precancerous polyps or small growths are found. Skin Cancer  Check your skin from head to toe regularly.  Tell your health care provider about any new moles or changes in moles, especially if there is a change in a mole's shape or color.  Also tell your health care provider if you have a mole that is larger than the size of a pencil eraser.  Always use sunscreen. Apply sunscreen liberally and repeatedly throughout the day.  Protect yourself by wearing long sleeves, pants, a wide-brimmed hat, and sunglasses whenever you are outside. HEART DISEASE, DIABETES, AND HIGH BLOOD PRESSURE   High blood pressure causes heart disease and increases the risk of stroke. High blood pressure is more likely to develop in:  People who have blood pressure in the high end   of the normal range (130-139/85-89 mm Hg).  People who are overweight or obese.  People who are African American.  If you are 38-23 years of age, have your blood pressure checked every 3-5 years. If you are 61 years of age or older, have your blood pressure checked every year. You should have your blood pressure measured twice--once when you are at a hospital or clinic, and once when you are not at a hospital or clinic. Record the average of the two measurements. To check your blood pressure when you are not at a hospital or clinic, you can use:  An automated blood pressure machine at a pharmacy.  A home blood pressure monitor.  If you are between 45 years and 39 years old, ask your health care provider if you should take aspirin to prevent strokes.  Have regular diabetes screenings. This involves taking a blood sample to check your fasting blood sugar level.  If you are at a normal weight and have a low risk for diabetes,  have this test once every three years after 32 years of age.  If you are overweight and have a high risk for diabetes, consider being tested at a younger age or more often. PREVENTING INFECTION  Hepatitis B  If you have a higher risk for hepatitis B, you should be screened for this virus. You are considered at high risk for hepatitis B if:  You were born in a country where hepatitis B is common. Ask your health care provider which countries are considered high risk.  Your parents were born in a high-risk country, and you have not been immunized against hepatitis B (hepatitis B vaccine).  You have HIV or AIDS.  You use needles to inject street drugs.  You live with someone who has hepatitis B.  You have had sex with someone who has hepatitis B.  You get hemodialysis treatment.  You take certain medicines for conditions, including cancer, organ transplantation, and autoimmune conditions. Hepatitis C  Blood testing is recommended for:  Everyone born from [birth year] through 1967.  Anyone with known risk factors for hepatitis C. Sexually transmitted infections (STIs)  You should be screened for sexually transmitted infections (STIs) including gonorrhea and chlamydia if:  You are sexually active and are younger than 32 years of age.  You are older than 32 years of age and your health care provider tells you that you are at risk for this type of infection.  Your sexual activity has changed since you were last screened and you are at an increased risk for chlamydia or gonorrhea. Ask your health care provider if you are at risk.  If you do not have HIV, but are at risk, it may be recommended that you take a prescription medicine daily to prevent HIV infection. This is called pre-exposure prophylaxis (PrEP). You are considered at risk if:  You are sexually active and do not regularly use condoms or know the HIV status of your partner(s).  You take drugs by injection.  You are sexually  active with a partner who has HIV. Talk with your health care provider about whether you are at high risk of being infected with HIV. If you choose to begin PrEP, you should first be tested for HIV. You should then be tested every 3 months for as long as you are taking PrEP.  PREGNANCY   If you are premenopausal and you may become pregnant, ask your health care provider about preconception counseling.  If you may  become pregnant, take 400 to 800 micrograms (mcg) of folic acid every day.  If you want to prevent pregnancy, talk to your health care provider about birth control (contraception). OSTEOPOROSIS AND MENOPAUSE   Osteoporosis is a disease in which the bones lose minerals and strength with aging. This can result in serious bone fractures. Your risk for osteoporosis can be identified using a bone density scan.  If you are 61 years of age or older, or if you are at risk for osteoporosis and fractures, ask your health care provider if you should be screened.  Ask your health care provider whether you should take a calcium or vitamin D supplement to lower your risk for osteoporosis.  Menopause may have certain physical symptoms and risks.  Hormone replacement therapy may reduce some of these symptoms and risks. Talk to your health care provider about whether hormone replacement therapy is right for you.  HOME CARE INSTRUCTIONS   Schedule regular health, dental, and eye exams.  Stay current with your immunizations.   Do not use any tobacco products including cigarettes, chewing tobacco, or electronic cigarettes.  If you are pregnant, do not drink alcohol.  If you are breastfeeding, limit how much and how often you drink alcohol.  Limit alcohol intake to no more than 1 drink per day for nonpregnant women. One drink equals 12 ounces of beer, 5 ounces of wine, or 1 ounces of hard liquor.  Do not use street drugs.  Do not share needles.  Ask your health care provider for help if  you need support or information about quitting drugs.  Tell your health care provider if you often feel depressed.  Tell your health care provider if you have ever been abused or do not feel safe at home.   This information is not intended to replace advice given to you by your health care provider. Make sure you discuss any questions you have with your health care provider.   Document Released: 09/09/2010 Document Revised: 03/17/2014 Document Reviewed: 01/26/2013 Elsevier Interactive Patient Education [employer].

## 2015-04-26 NOTE — Progress Notes (Signed)
Subjective:    Patient ID: Renee Fleming, female    DOB: 06/23/1985, 30 y.o.   MRN: [id number]  HPI  29YO female presents for physical exam.  Recently had 3rd vaginal delivery. Continues to nurse. Not on any medication. Having regular periods. Not using contraception.    Wt Readings from Last 3 Encounters:  04/26/15 147 lb (66.679 kg)  01/03/15 140 lb (63.504 kg)  11/22/14 157 lb (71.215 kg)   BP Readings from Last 3 Encounters:  04/26/15 108/75  01/03/15 116/72  11/24/14 96/63    No past medical history on file. Family History  Problem Relation Age of Onset  . Cancer Paternal Grandmother     kidney   Past Surgical History  Procedure Laterality Date  . Wisdom tooth extraction    . Vaginal delivery      3x no complications   Social History   Social History  . Marital Status: Married    Spouse Name: N/A  . Number of Children: N/A  . Years of Education: N/A   Social History Main Topics  . Smoking status: Former Smoker    Quit date: 01/15/2008  . Smokeless tobacco: Never Used  . Alcohol Use: 1.0 oz/week    2 Standard drinks or equivalent per week  . Drug Use: No  . Sexual Activity: Not Currently    Birth Control/ Protection: None   Other Topics Concern  . None   Social History Narrative   Lives in [place] with husband and 2 daughters 4YO and 2YO (daycare)      Work - full time as Marine scientist.      Diet - regular      Exercise - 3-4x per week, runs 10-44mile/week    Review of Systems  Constitutional: Negative for fever, chills, appetite change, fatigue and unexpected weight change.  Eyes: Negative for visual disturbance.  Respiratory: Negative for cough and shortness of breath.   Cardiovascular: Negative for chest pain and leg swelling.  Gastrointestinal: Negative for nausea, vomiting, abdominal pain, diarrhea and constipation.  Musculoskeletal: Negative for myalgias and arthralgias.  Skin: Negative for color change and rash.    Hematological: Negative for adenopathy. Does not bruise/bleed easily.  Psychiatric/Behavioral: Negative for sleep disturbance and dysphoric mood. The patient is not nervous/anxious.        Objective:    BP 108/75 mmHg  Pulse 63  Temp(Src) 98.2 F (36.8 C) (Oral)  Ht 5' 3.5" (1.613 m)  Wt 147 lb (66.679 kg)  BMI 25.63 kg/m2  SpO2 100%  Breastfeeding? Yes Physical Exam  Constitutional: She is oriented to person, place, and time. She appears well-developed and well-nourished. No distress.  HENT:  Head: Normocephalic and atraumatic.  Right Ear: External ear normal.  Left Ear: External ear normal.  Nose: Nose normal.  Mouth/Throat: Oropharynx is clear and moist. No oropharyngeal exudate.  Eyes: Conjunctivae and EOM are normal. Pupils are equal, round, and reactive to light. Right eye exhibits no discharge.  Neck: Normal range of motion. Neck supple. No thyromegaly present.  Cardiovascular: Normal rate, regular rhythm, normal heart sounds and intact distal pulses.  Exam reveals no gallop and no friction rub.   No murmur heard. Pulmonary/Chest: Effort normal. No respiratory distress. She has no wheezes. She has no rales.  Abdominal: Soft. Bowel sounds are normal. She exhibits no distension and no mass. There is no tenderness. There is no rebound and no guarding. A hernia is present. Hernia confirmed positive in the ventral area.  Musculoskeletal: Normal  range of motion. She exhibits no edema or tenderness.  Lymphadenopathy:    She has no cervical adenopathy.  Neurological: She is alert and oriented to person, place, and time. No cranial nerve deficit. Coordination normal.  Skin: Skin is warm and dry. No rash noted. She is not diaphoretic. No erythema. No pallor.  Psychiatric: She has a normal mood and affect. Her behavior is normal. Judgment and thought content normal.          Assessment & Plan:   Problem List Items Addressed This Visit      Unprioritized   Routine general  medical examination at a health care facility - Primary    General medical exam normal today. PAP and pelvic and breast exam deferred as completed by OB. Reviewed labs from 12/2014. Will hold off on additional labs for now. Immunizations are UTD. Encouraged healthy diet and exercise. Follow up 1 year and prn          Return in about 1 year (around 04/25/2016) for Physical.

## 2015-05-18 ENCOUNTER — Encounter: Payer: BLUE CROSS/BLUE SHIELD | Admitting: Obstetrics and Gynecology

## 2015-05-21 IMAGING — US US OB < 14 WEEKS - US OB TV
1 series · 13 of 28 positions shown · non-contrast
Comparison: 11/26/2007.

CLINICAL DATA: Abdominal pain. Five weeks and 0 days pregnant by
last menstrual period. No quantitative beta HCG available.

EXAM:
OBSTETRIC <14 WK US AND TRANSVAGINAL OB US
TECHNIQUE: Both transabdominal and transvaginal ultrasound examinations were
performed for complete evaluation of the gestation as well as the
maternal uterus, adnexal regions, and pelvic cul-de-sac.
Transvaginal technique was performed to assess early pregnancy.

[Series 1: us ob < 14 weeks - us ob tv · 0.22mm/px · 13 of 84 slices shown]
[im 4/84]
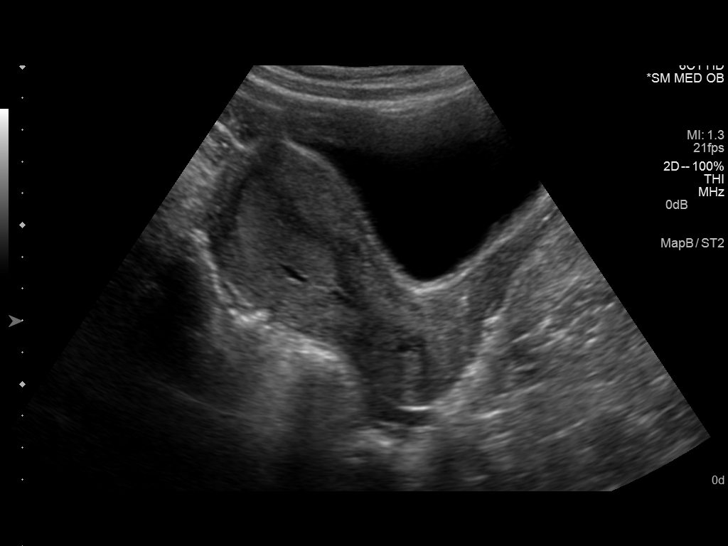
[im 10/84]
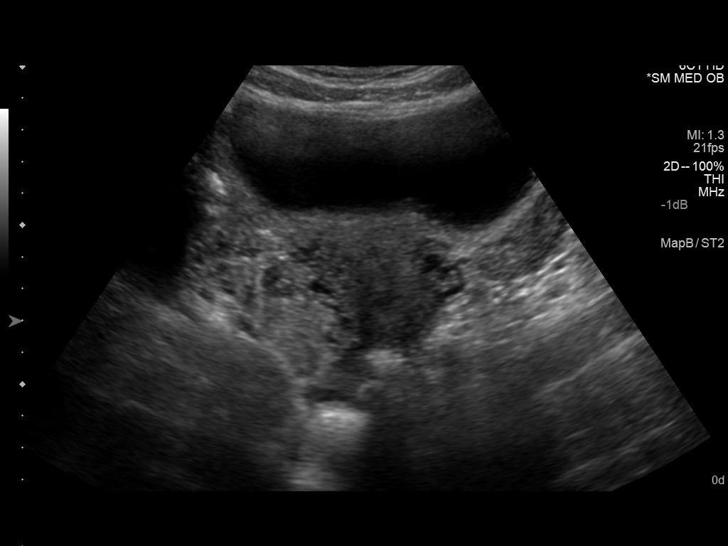
[im 16/84]
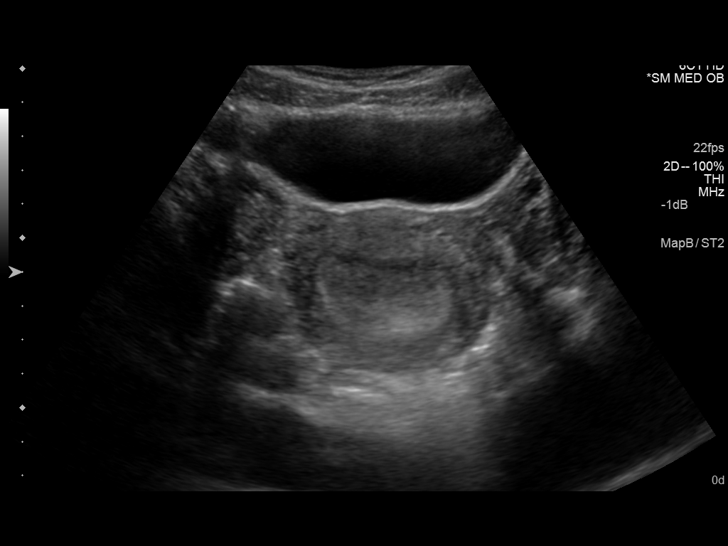
[im 22/84]
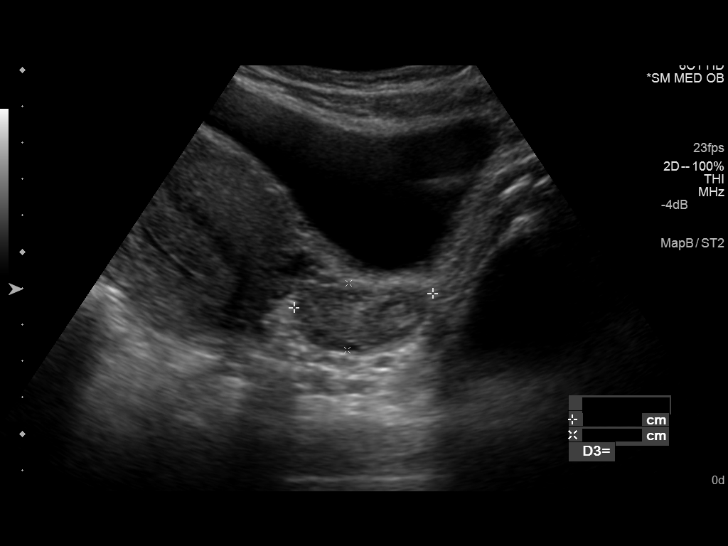
[im 28/84]
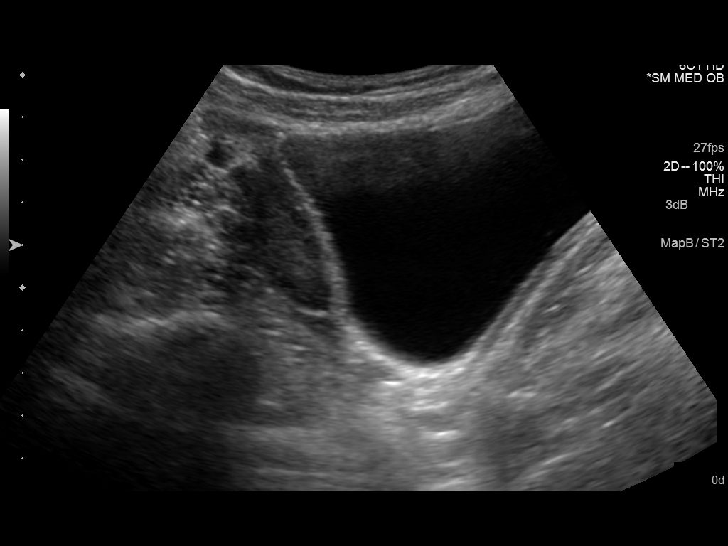
[im 34/84]
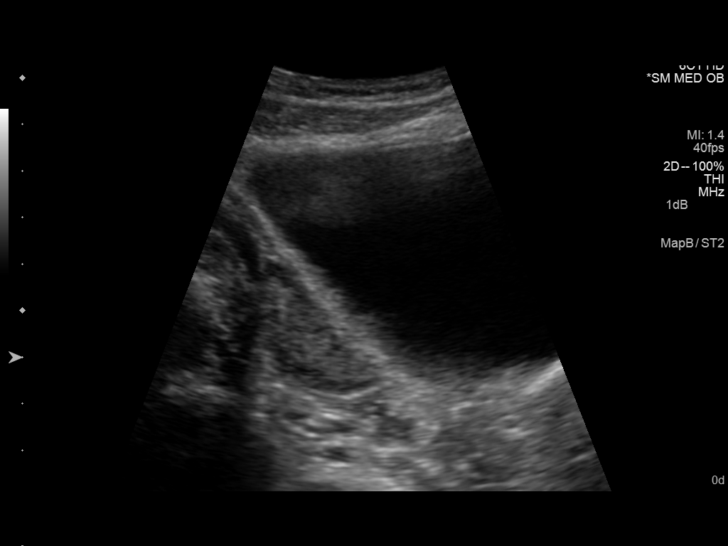
[im 44/84]
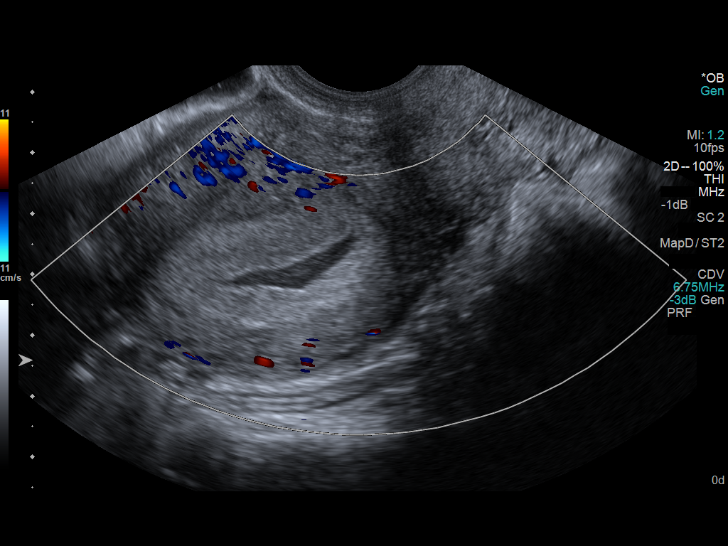
[im 50/84]
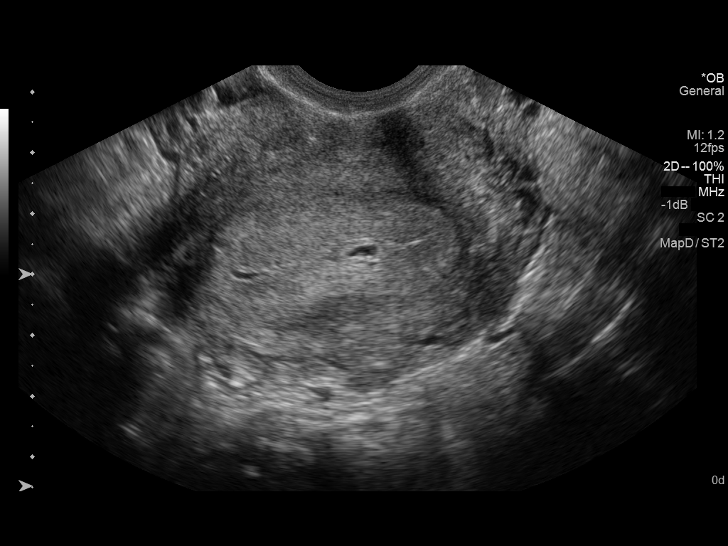
[im 56/84]
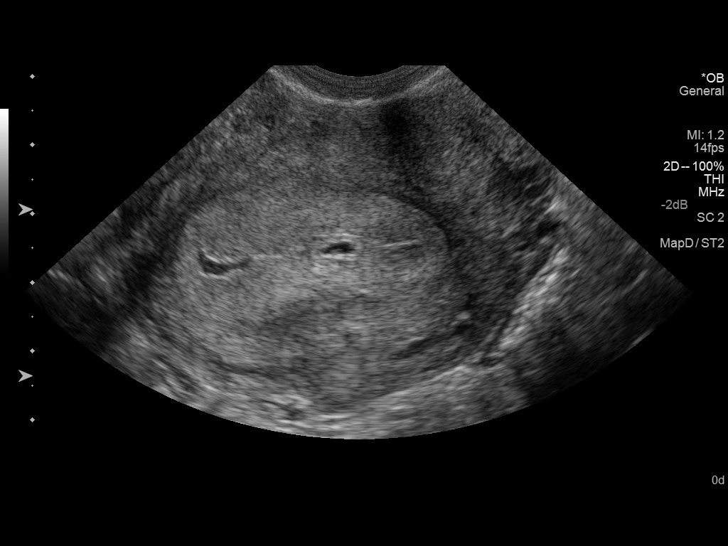
[im 62/84]
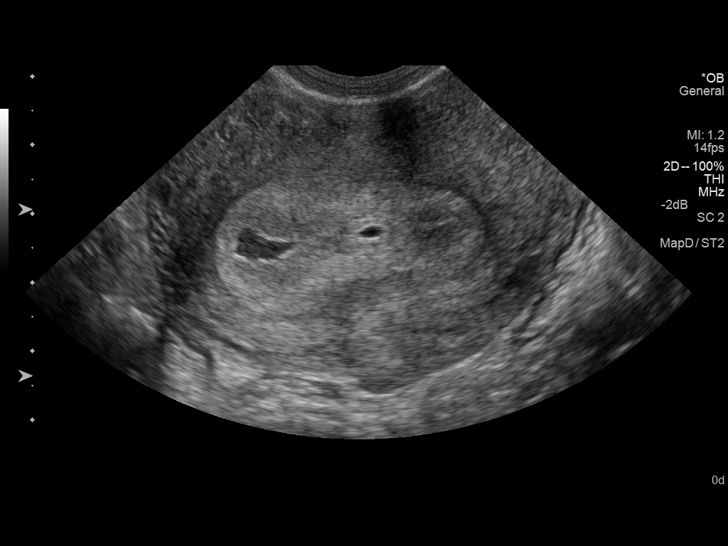
[im 68/84]
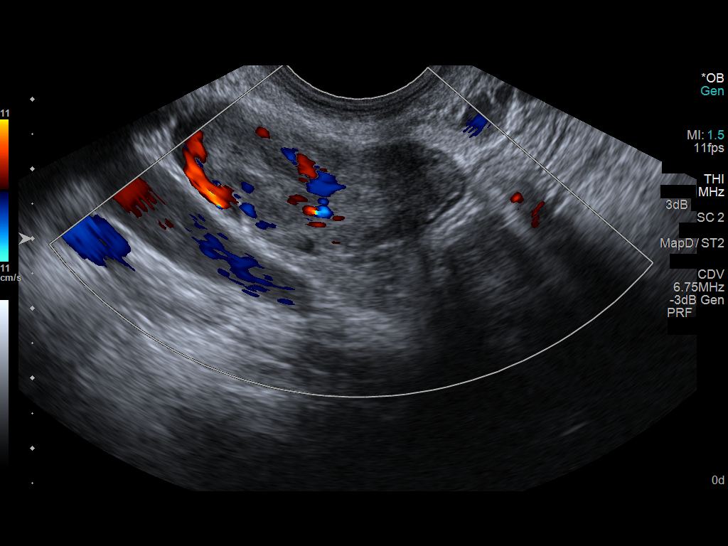
[im 74/84]
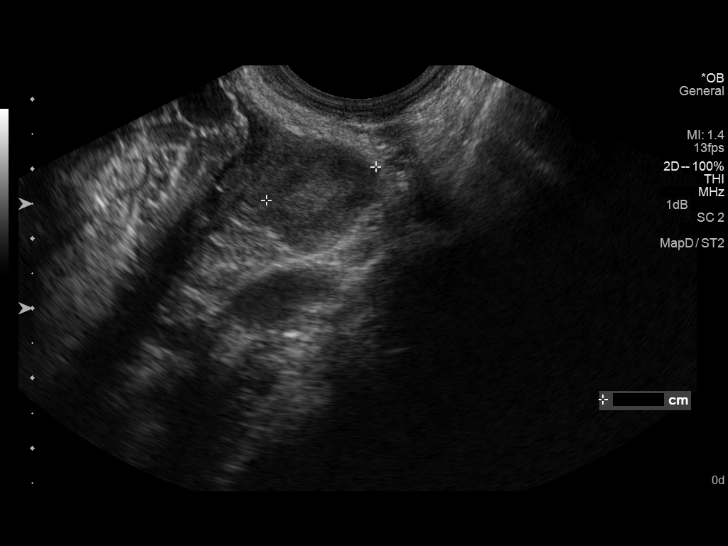
[im 80/84]
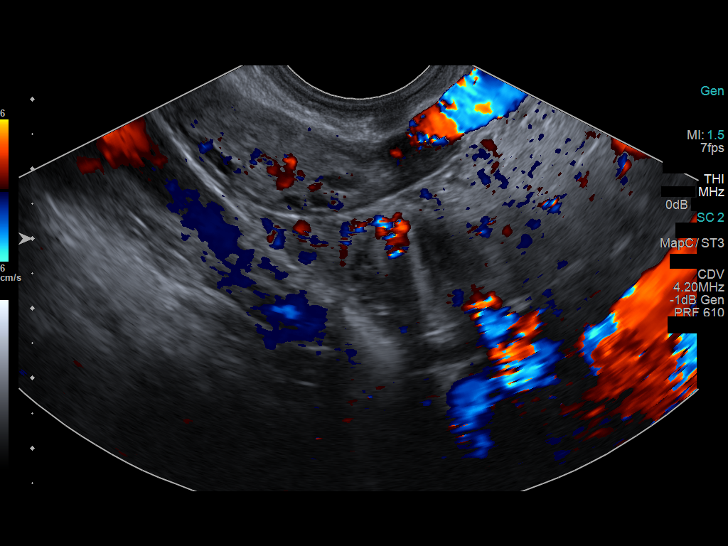

[13 of 28 positions shown; findings below may reference images not displayed]

FINDINGS: Intrauterine gestational sac: Visualized/normal in shape.

Yolk sac:  Not visualized

Embryo:  Not visualized

Cardiac Activity: Not visualized

MSD:  3.6  mm   5 w   1  d       US EDC: 08/13/2014.

Maternal uterus/adnexae: Small to moderate amount of fluid with
internal echoes eccentrically within the endometrial canal or
adjacent to the endometrial canal in the uterus on the right. Normal
appearing right ovary. Left ovarian corpus luteum. Small amount of
free peritoneal fluid.
IMPRESSION: 1. Intrauterine gestational sac with no visible fetal pole at this
time. Serial quantitative beta HCG determinations and follow-up
obstetrical ultrasound are recommended in 2 weeks to confirm a
viable intrauterine gestation.
2. Small to moderate amount of subchorionic hemorrhage or eccentric
blood within the endometrial canal on the right.
3. Small amount of free peritoneal fluid.

## 2015-06-14 ENCOUNTER — Encounter: Payer: Self-pay | Admitting: Obstetrics and Gynecology

## 2015-06-14 ENCOUNTER — Other Ambulatory Visit: Payer: Self-pay | Admitting: Obstetrics and Gynecology

## 2015-06-14 ENCOUNTER — Ambulatory Visit (INDEPENDENT_AMBULATORY_CARE_PROVIDER_SITE_OTHER): Payer: BLUE CROSS/BLUE SHIELD | Admitting: Obstetrics and Gynecology

## 2015-06-14 VITALS — BP 112/65 | HR 78 | Ht 62.0 in | Wt 146.0 lb

## 2015-06-14 DIAGNOSIS — Z01419 Encounter for gynecological examination (general) (routine) without abnormal findings: Secondary | ICD-10-CM | POA: Diagnosis not present

## 2015-06-14 NOTE — Progress Notes (Signed)
Subjective:   Margart Sickleslyson Therriault is a 30 y.o. [phone] Caucasian female here for a routine well-woman exam.  Patient's last menstrual period was 06/07/2015.    Current complaints: occasional stress incontinence PCP: ?       Doesn't need labs  Social History: Sexual: heterosexual Marital Status: married Living situation: with family Occupation: unknown occupation Tobacco/alcohol: no tobacco use Illicit drugs: no history of illicit drug use  The following portions of the patient's history were reviewed and updated as appropriate: allergies, current medications, past family history, past medical history, past social history, past surgical history and problem list.  Past Medical History History reviewed. No pertinent past medical history.  Past Surgical History Past Surgical History  Procedure Laterality Date  . Wisdom tooth extraction    . Vaginal delivery      3x no complications    Gynecologic History [id number]  Patient's last menstrual period was 06/07/2015. Contraception: coitus interruptus Last Pap: 2015. Results were: normal  Obstetric History OB History  Gravida Para Term Preterm AB SAB TAB Ectopic Multiple Living  4 3 2  1 1    0 2    # Outcome Date GA Lbr Len/2nd Weight Sex Delivery Anes PTL Lv  4 Term 11/22/14 [id number]  7 lb 9.3 oz (3.44 kg) F Vag-Spont None    3 Term 2012   8 lb 9 oz (3.884 kg) F Vag-Spont   Y  2 Para 2010    F Vag-Spont   Y  1 SAB               Current Medications No current outpatient prescriptions on file prior to visit.   No current facility-administered medications on file prior to visit.    Review of Systems Patient denies any headaches, blurred vision, shortness of breath, chest pain, abdominal pain, problems with bowel movements, urination, or intercourse.  Objective:  BP 112/65 mmHg  Pulse 78  Ht 5\' 2"  (1.575 m)  Wt 146 lb (66.225 kg)  BMI 26.70 kg/m2  LMP 06/07/2015  Breastfeeding? Yes Physical Exam  General:  Well developed, well  nourished, no acute distress. She is alert and oriented x3. Skin:  Warm and dry Neck:  Midline trachea, no thyromegaly or nodules Cardiovascular: Regular rate and rhythm, no murmur heard Lungs:  Effort normal, all lung fields clear to auscultation bilaterally Breasts:  No dominant palpable mass, retraction, or nipple discharge Abdomen:  Soft, non tender, no hepatosplenomegaly or masses;diastasis recti persist with umbilcal Pelvic:  External genitalia is normal in appearance.  The vagina is normal in appearance. The cervix is bulbous, no CMT.  Thin prep pap is done with HR HPV cotesting. Uterus is felt to be normal size, shape, and contour.  No adnexal masses or tenderness noted. Extremities:  No swelling or varicosities noted Psych:  She has a normal mood and affect  Assessment:   Healthy well-woman exam Breast feeding Umbilical hernia with diastasis recti Stress incontinence  Plan:  Pap obtained Spouse scheduled for vasectomy on May 7th Will see surgeon after finishes breast feeding F/U 1 year for AE, or sooner if needed   Melody Suzan NailerN Shambley, CNM

## 2015-06-14 NOTE — Patient Instructions (Signed)
Place annual gynecologic exam patient instructions here.

## 2015-06-18 LAB — CYTOLOGY - PAP

## 2015-07-11 ENCOUNTER — Telehealth: Payer: Self-pay | Admitting: Family Medicine

## 2015-07-11 ENCOUNTER — Encounter: Payer: Self-pay | Admitting: Internal Medicine

## 2015-07-11 ENCOUNTER — Inpatient Hospital Stay (HOSPITAL_COMMUNITY)
Admission: EM | Admit: 2015-07-11 | Discharge: 2015-07-13 | DRG: 440 | Disposition: A | Discharge status: Home / Self Care | Payer: BLUE CROSS/BLUE SHIELD | Attending: Internal Medicine | Admitting: Internal Medicine

## 2015-07-11 ENCOUNTER — Encounter: Payer: Self-pay | Admitting: Family Medicine

## 2015-07-11 ENCOUNTER — Encounter (HOSPITAL_COMMUNITY): Payer: Self-pay | Admitting: Emergency Medicine

## 2015-07-11 ENCOUNTER — Telehealth: Payer: BLUE CROSS/BLUE SHIELD | Admitting: Nurse Practitioner

## 2015-07-11 ENCOUNTER — Ambulatory Visit (INDEPENDENT_AMBULATORY_CARE_PROVIDER_SITE_OTHER): Payer: BLUE CROSS/BLUE SHIELD | Admitting: Family Medicine

## 2015-07-11 VITALS — BP 112/64 | HR 87 | Temp 98.4°F | Ht 62.0 in | Wt 145.6 lb

## 2015-07-11 DIAGNOSIS — K8500 Idiopathic acute pancreatitis without necrosis or infection: Secondary | ICD-10-CM | POA: Diagnosis not present

## 2015-07-11 DIAGNOSIS — K859 Acute pancreatitis without necrosis or infection, unspecified: Secondary | ICD-10-CM | POA: Diagnosis present

## 2015-07-11 DIAGNOSIS — K591 Functional diarrhea: Secondary | ICD-10-CM

## 2015-07-11 DIAGNOSIS — R1013 Epigastric pain: Secondary | ICD-10-CM | POA: Diagnosis not present

## 2015-07-11 DIAGNOSIS — Z87891 Personal history of nicotine dependence: Secondary | ICD-10-CM

## 2015-07-11 LAB — COMPREHENSIVE METABOLIC PANEL
ALT: 16 U/L (ref 0–35)
ALT: 22 U/L (ref 14–54)
ANION GAP: 12 (ref 5–15)
AST: 19 U/L (ref 0–37)
AST: 24 U/L (ref 15–41)
Albumin: 4.8 g/dL (ref 3.5–5.2)
Albumin: 4.8 g/dL (ref 3.5–5.0)
Alkaline Phosphatase: 67 U/L (ref 39–117)
Alkaline Phosphatase: 72 U/L (ref 38–126)
BILIRUBIN TOTAL: 0.5 mg/dL (ref 0.2–1.2)
BILIRUBIN TOTAL: 0.5 mg/dL (ref 0.3–1.2)
BUN: 15 mg/dL (ref 6–23)
BUN: 14 mg/dL (ref 6–20)
CO2: 28 meq/L (ref 19–32)
CO2: 22 mmol/L (ref 22–32)
CREATININE: 0.81 mg/dL (ref 0.40–1.20)
Calcium: 9.8 mg/dL (ref 8.4–10.5)
Calcium: 10 mg/dL (ref 8.9–10.3)
Chloride: 104 mEq/L (ref 96–112)
Chloride: 107 mmol/L (ref 101–111)
Creatinine, Ser: 0.77 mg/dL (ref 0.44–1.00)
GFR: 87.25 mL/min (ref >60.00)
GLUCOSE: 98 mg/dL (ref 70–99)
Glucose, Bld: 118 mg/dL — ABNORMAL HIGH (ref 65–99)
POTASSIUM: 4 mmol/L (ref 3.5–5.1)
Potassium: 4.5 mEq/L (ref 3.5–5.1)
Sodium: 138 mEq/L (ref 135–145)
Sodium: 141 mmol/L (ref 135–145)
TOTAL PROTEIN: 7.8 g/dL (ref 6.5–8.1)
Total Protein: 7.4 g/dL (ref 6.0–8.3)

## 2015-07-11 LAB — CBC
HCT: 38 % (ref 36.0–46.0)
HEMOGLOBIN: 13 g/dL (ref 12.0–15.0)
MCHC: 34.2 g/dL (ref 30.0–36.0)
MCV: 86.4 fl (ref 78.0–100.0)
Platelets: 233 10*3/uL (ref 150.0–400.0)
RBC: 4.39 Mil/uL (ref 3.87–5.11)
RDW: 13.2 % (ref 11.5–15.5)
WBC: 8.4 10*3/uL (ref 4.0–10.5)

## 2015-07-11 LAB — CBC WITH DIFFERENTIAL/PLATELET
BASOS PCT: 0 %
Basophils Absolute: 0 10*3/uL (ref 0.0–0.1)
EOS ABS: 0 10*3/uL (ref 0.0–0.7)
Eosinophils Relative: 0 %
HCT: 39.3 % (ref 36.0–46.0)
HEMOGLOBIN: 13.4 g/dL (ref 12.0–15.0)
Lymphocytes Relative: 10 %
Lymphs Abs: 1.3 10*3/uL (ref 0.7–4.0)
MCH: 29.4 pg (ref 26.0–34.0)
MCHC: 34.1 g/dL (ref 30.0–36.0)
MCV: 86.2 fL (ref 78.0–100.0)
MONOS PCT: 4 %
Monocytes Absolute: 0.5 10*3/uL (ref 0.1–1.0)
NEUTROS PCT: 86 %
Neutro Abs: 10.5 10*3/uL — ABNORMAL HIGH (ref 1.7–7.7)
PLATELETS: 252 10*3/uL (ref 150–400)
RBC: 4.56 MIL/uL (ref 3.87–5.11)
RDW: 12.4 % (ref 11.5–15.5)
WBC: 12.2 10*3/uL — AB (ref 4.0–10.5)

## 2015-07-11 LAB — ETHANOL

## 2015-07-11 LAB — LIPASE, BLOOD: Lipase: 268 U/L — ABNORMAL HIGH (ref 11–51)

## 2015-07-11 LAB — POCT URINE PREGNANCY: PREG TEST UR: NEGATIVE

## 2015-07-11 LAB — LIPASE: LIPASE: 289 U/L — AB (ref 11.0–59.0)

## 2015-07-11 MED ORDER — SODIUM CHLORIDE 0.9 % IV SOLN
INTRAVENOUS | Status: DC
  Administered 2015-07-12 – 2015-07-13 (×5): via INTRAVENOUS

## 2015-07-11 MED ORDER — SODIUM CHLORIDE 0.9 % IV BOLUS (SEPSIS)
1000.0000 mL | Freq: Once | INTRAVENOUS | Status: AC
  Administered 2015-07-11: 1000 mL via INTRAVENOUS

## 2015-07-11 MED ORDER — LACTATED RINGERS IV BOLUS (SEPSIS)
1000.0000 mL | Freq: Once | INTRAVENOUS | Status: AC
  Administered 2015-07-11: 1000 mL via INTRAVENOUS

## 2015-07-11 MED ORDER — ENOXAPARIN SODIUM 40 MG/0.4ML ~~LOC~~ SOLN
40.0000 mg | Freq: Every day | SUBCUTANEOUS | Status: DC
  Administered 2015-07-12 – 2015-07-13 (×2): 40 mg via SUBCUTANEOUS
  Filled 2015-07-11: qty 0.4

## 2015-07-11 NOTE — ED Notes (Signed)
Admitting MD at the bedside.  

## 2015-07-11 NOTE — H&P (Signed)
History and Physical    Renee Fleming ZOX:[id number] DOB: Oct 07, 1985 DOA: 07/11/2015  Referring MD/NP/PA: Dr. Jeraldine Loots PCP: Wynona Dove, MD Outpatient Specialists: None Patient coming from: Home  Chief Complaint: Epigastric pain  HPI: Renee Fleming is a 30 y.o. female with medical history significant of recent vaginal delivery, still breast feeding.  Patient presents to the ED with c/o epigastric abdominal pain.  No radiation.  Has nausea but no vomiting.  Pain is worse with eating.  Pain onset when she ate some peanut butter and celery yesterday.  Pain has been persistent since onset.  Pain is severe.  No fever.  Does drink occasional EtOH but not heavy drinker.  Review of Systems: As per HPI otherwise 10 point review of systems negative.    History reviewed. No pertinent past medical history.  Past Surgical History  Procedure Laterality Date  . Wisdom tooth extraction    . Vaginal delivery      3x no complications     reports that she quit smoking about 7 years ago. She has never used smokeless tobacco. She reports that she drinks about 1.0 oz of alcohol per week. She reports that she does not use illicit drugs.  Allergies  Allergen Reactions  . Neosporin [Neomycin-Bacitracin Zn-Polymyx] Rash    Family History  Problem Relation Age of Onset  . Cancer Paternal Grandmother     kidney     Prior to Admission medications   Medication Sig Start Date End Date Taking? Authorizing Provider  Prenatal Vit-Fe Fumarate-FA (PRENATAL MULTIVITAMIN) TABS tablet Take 1 tablet by mouth daily at 12 noon.   Yes Historical Provider, MD    Physical Exam: Filed Vitals:   07/11/15 2230 07/11/15 2245 07/11/15 2300 07/11/15 2315  BP: 109/66 108/66 104/74 108/70  Pulse: 103 112 107 101  Temp:      TempSrc:      Resp:      Height:      Weight:      SpO2: 100% 100% 99% 100%      Constitutional: NAD, calm, comfortable Filed Vitals:   07/11/15 2230 07/11/15 2245 07/11/15 2300  07/11/15 2315  BP: 109/66 108/66 104/74 108/70  Pulse: 103 112 107 101  Temp:      TempSrc:      Resp:      Height:      Weight:      SpO2: 100% 100% 99% 100%   Eyes: PERRL, lids and conjunctivae normal ENMT: Mucous membranes are moist. Posterior pharynx clear of any exudate or lesions.Normal dentition.  Neck: normal, supple, no masses, no thyromegaly Respiratory: clear to auscultation bilaterally, no wheezing, no crackles. Normal respiratory effort. No accessory muscle use.  Cardiovascular: Regular rate and rhythm, no murmurs / rubs / gallops. No extremity edema. 2+ pedal pulses. No carotid bruits.  Abdomen: no tenderness, no masses palpated. No hepatosplenomegaly. Bowel sounds positive.  Musculoskeletal: no clubbing / cyanosis. No joint deformity upper and lower extremities. Good ROM, no contractures. Normal muscle tone.  Skin: no rashes, lesions, ulcers. No induration Neurologic: CN 2-12 grossly intact. Sensation intact, DTR normal. Strength 5/5 in all 4.  Psychiatric: Normal judgment and insight. Alert and oriented x 3. Normal mood.    Labs on Admission: I have personally reviewed following labs and imaging studies  CBC:  Recent Labs Lab 07/11/15 1447 07/11/15 2141  WBC 8.4 12.2*  NEUTROABS  --  10.5*  HGB 13.0 13.4  HCT 38.0 39.3  MCV 86.4 86.2  PLT 233.0 252  Basic Metabolic Panel:  Recent Labs Lab 07/11/15 1447 07/11/15 2141  NA 138 141  K 4.5 4.0  CL 104 107  CO2 28 22  GLUCOSE 98 118*  BUN 15 14  CREATININE 0.81 0.77  CALCIUM 9.8 10.0   GFR: Estimated Creatinine Clearance: 92.2 mL/min (by C-G formula based on Cr of 0.77). Liver Function Tests:  Recent Labs Lab 07/11/15 1447 07/11/15 2141  AST 19 24  ALT 16 22  ALKPHOS 67 72  BILITOT 0.5 0.5  PROT 7.4 7.8  ALBUMIN 4.8 4.8    Recent Labs Lab 07/11/15 1447 07/11/15 2141  LIPASE 289.0* 268*   No results for input(s): AMMONIA in the last 168 hours. Coagulation Profile: No results for  input(s): INR, PROTIME in the last 168 hours. Cardiac Enzymes: No results for input(s): CKTOTAL, CKMB, CKMBINDEX, TROPONINI in the last 168 hours. BNP (last 3 results) No results for input(s): PROBNP in the last 8760 hours. HbA1C: No results for input(s): HGBA1C in the last 72 hours. CBG: No results for input(s): GLUCAP in the last 168 hours. Lipid Profile: No results for input(s): CHOL, HDL, LDLCALC, TRIG, CHOLHDL, LDLDIRECT in the last 72 hours. Thyroid Function Tests: No results for input(s): TSH, T4TOTAL, FREET4, T3FREE, THYROIDAB in the last 72 hours. Anemia Panel: No results for input(s): VITAMINB12, FOLATE, FERRITIN, TIBC, IRON, RETICCTPCT in the last 72 hours. Urine analysis:    Component Value Date/Time   BILIRUBINUR neg 11/21/2014 1422   PROTEINUR neg 11/21/2014 1422   UROBILINOGEN 0.2 11/21/2014 1422   NITRITE neg 11/21/2014 1422   LEUKOCYTESUR Negative 11/21/2014 1422   Sepsis Labs: @LABRCNTIP (procalcitonin:4,lacticidven:4) )No results found for this or any previous visit (from the past 240 hour(s)).   Radiological Exams on Admission: No results found.  EKG: Independently reviewed.  Assessment/Plan Active Problems:   Pancreatitis    Pancreatitis -  IVF  US abdomen  EtOH vs idiopathic, gallstone seems much less likely with completely normal transaminases but will rule out with US.  Patient requests no narcotics right now       DVT prophylaxis: Lovenox Code Status: Full Family Communication: Husband at bedside Consults called: None Admission status: Admit to obs   Keyon Liller, Heywood IlesJARED M. DO [facility] Pager [phone] from 7PM-7AM  If 7AM-7PM, please contact the day physician for the patient [email or web address] Password [email or web address]  07/11/2015, 11:52 PM

## 2015-07-11 NOTE — Progress Notes (Signed)
Patient ID: Renee Fleming, female   DOB: [birth year], 30 y.o.   MRN: [id number]  Tommi Rumps, MD Phone: [phone]  Renee Fleming is a 30 y.o. female who presents today for same-day visit.  Patient notes onset of epigastric abdominal pain yesterday after eating celery and peanut butter. Notes the pain started 15 minutes later. States it was excruciating pain. She had indigestion with this with a burning reflux sensation in her chest. She had no chest pressure, shortness of breath, diaphoresis, or radiation of the burning sensation. She notes the epigastric discomfort was sharp. She notes getting up and walking helped ease the pain off. She notes the pain is no longer excruciating though she has a dull discomfort in her epigastric region. Notes the pain is worse when she eats. No nausea or vomiting. She does note some mildly loose stools with this. She's previously had a similar reaction when she ate peanuts before. She does have a ventral hernia though notes the hernia is always reducible. She has no discomfort in the area of the hernia which is from her belly button down. She passes gas. No urinary symptoms. No blood in her stool. LMP was in the last week. No history of hypertension, hyperlipidemia, or diabetes. No family history of heart disease. No indigestion at this time. No fevers.  PMH: Former smoker   ROS see history of present illness  Objective  Physical Exam Filed Vitals:   07/11/15 1404  BP: 112/64  Pulse: 87  Temp: 98.4 F (36.9 C)    BP Readings from Last 3 Encounters:  07/11/15 112/64  06/14/15 112/65  04/26/15 108/75   Wt Readings from Last 3 Encounters:  07/11/15 145 lb 9.6 oz (66.044 kg)  06/14/15 146 lb (66.225 kg)  04/26/15 147 lb (66.679 kg)    Physical Exam  Constitutional: She is well-developed, well-nourished, and in no distress.  HENT:  Head: Normocephalic and atraumatic.  Right Ear: External ear normal.  Left Ear: External ear normal.    Cardiovascular: Normal rate, regular rhythm and normal heart sounds.   Pulmonary/Chest: Effort normal and breath sounds normal.  Abdominal: Soft. Bowel sounds are normal. She exhibits no distension and no mass. There is tenderness (mild epigastric tenderness to palpation, no other tenderness or abdomen). There is no rebound and no guarding.    Skin: She is not diaphoretic.     Assessment/Plan: Please see individual problem list.  Abdominal pain, epigastric Patient with onset of epigastric discomfort last night following eating peanut butter and celery. Pain has improved throughout the day. Minimal tenderness on exam though no peritoneal signs. No masses palpated. She does have a ventral hernia though this is fully reducible and nontender. Unlikely related to her hernia given location and benign exam at hernia site. Urine pregnancy test is negative. No urinary symptoms. No vaginal symptoms. No pelvic pain. Did have indigestion. No risk factors for heart disease. Burning discomfort was atypical. Suspect gastritis with reflux or food intolerance as possible causes. Potentially could be related to peptic ulcer disease, pancreatic pathology, gallbladder pathology, or liver pathology. We'll check CBC, CMP, and lipase. She can take Tums for any further discomfort. She is breast-feeding and on review of up-to-date it appears that Tums should be safe with breast-feeding. She'll continue to monitor. She will avoid peanut butter and peanuts. She is given return precautions.     Orders Placed This Encounter  Procedures  . CBC  . Lipase  . Comp Met (CMET)  . POCT urine pregnancy  Tommi Rumps, MD [place]

## 2015-07-11 NOTE — Patient Instructions (Signed)
Nice to meet you. Your discomfort could be related to stomach irritation, stomach ulcer, pancreatic issue, gallbladder issue, or food intolerance. We will check lab work to evaluate this further. You can take Tums for any discomfort. You should avoid peanuts and peanut-based products. If you develop worsening abdominal discomfort or fevers, blood in her stool, urinary complaints, nausea, vomiting, chest pain, trouble breathing, or hernia does not fully reduce or urinary becomes painful, or any new or changing symptoms please seek medical attention.

## 2015-07-11 NOTE — Progress Notes (Signed)
We are sorry that you are not feeling well.  Here is how we plan to help!  Based on what you have shared with me it looks like you have Acute Infectious Diarrhea.  Most cases of acute diarrhea are due to infections with virus and bacteria and are self-limited conditions lasting less than 14 days.  For your symptoms you may take Imodium 2 mg tablets that are over the counter at your local pharmacy. Take two tablet now and then one after each loose stool up to 6 a day.  Antibiotics are not needed for most people with diarrhea.  I DOUBT THIS IS A PEANUT ALLERGY- PEANUT ALLERGY USUALLY CAUSES RASH AND POSSIBLY SHORTNESS OF BREATH. THIS JUST SOUNDS LIKE CLASSIC DIARRHEA WHICH SHOULD RESOLVES BUT IF DOES NOT OR DEVELOP A FEVER OR WORSENING PAIN GO TO er   HOME CARE  We recommend changing your diet to help with your symptoms for the next few days.  Drink plenty of fluids that contain water salt and sugar. Sports drinks such as Gatorade may help.   You may try broths, soups, bananas, applesauce, soft breads, mashed potatoes or crackers.   You are considered infectious for as long as the diarrhea continues. Hand washing or use of alcohol based hand sanitizers is recommend.  It is best to stay out of work or school until your symptoms stop.   GET HELP RIGHT AWAY  If you have dark yellow colored urine or do not pass urine frequently you should drink more fluids.    If your symptoms worsen   If you feel like you are going to pass out (faint)  You have a new problem  MAKE SURE YOU   Understand these instructions.  Will watch your condition.  Will get help right away if you are not doing well or get worse.  Your e-visit answers were reviewed by a board certified advanced clinical practitioner to complete your personal care plan.  Depending on the condition, your plan could have included both over the counter or prescription medications.  If there is a problem please reply  once you have  received a response from your provider.  Your safety is important to us.  If you have drug allergies check your prescription carefully.    You can use MyChart to ask questions about today's visit, request a non-urgent call back, or ask for a work or school excuse for 24 hours related to this e-Visit. If it has been greater than 24 hours you will need to follow up with your provider, or enter a new e-Visit to address those concerns.   You will get an e-mail in the next two days asking about your experience.  I hope that your e-visit has been valuable and will speed your recovery. Thank you for using e-visits.

## 2015-07-11 NOTE — ED Provider Notes (Signed)
CSN: [id number]     Arrival date & time 07/11/15  1905 History   First MD Initiated Contact with Patient 07/11/15 2118     No chief complaint on file.   HPI Patient presents with concern of abdominal pain, nausea. Symptoms began about 24 hours ago. Patient notes that she was generally well prior to onset. Patient does drink approximately one glass wine daily, and recalls exercising yesterday. Soon after eating a snack, celery, peanut butter, she felt the sudden onset of pain. Since that time the pain is severe, persistent, focally in the epigastrium Pain is nonradiating. There is associated nausea, anorexia, but no vomiting, fever. No medication taken.   History reviewed. No pertinent past medical history. Past Surgical History  Procedure Laterality Date  . Wisdom tooth extraction    . Vaginal delivery      3x no complications   Family History  Problem Relation Age of Onset  . Cancer Paternal Grandmother     kidney   Social History  Substance Use Topics  . Smoking status: Former Smoker    Quit date: 01/15/2008  . Smokeless tobacco: Never Used  . Alcohol Use: 1.0 oz/week    2 Standard drinks or equivalent per week   OB History    Gravida Para Term Preterm AB TAB SAB Ectopic Multiple Living   4 3 2  1  1   0 2     Review of Systems  Constitutional:       Per HPI, otherwise negative  HENT:       Per HPI, otherwise negative  Respiratory:       Per HPI, otherwise negative  Cardiovascular:       Per HPI, otherwise negative  Gastrointestinal: Positive for nausea. Negative for vomiting.  Endocrine:       Negative aside from HPI  Genitourinary:       Neg aside from HPI   Musculoskeletal:       Per HPI, otherwise negative  Skin: Negative.   Neurological: Negative for syncope.      Allergies  Neosporin  Home Medications   Prior to Admission medications   Medication Sig Start Date End Date Taking? Authorizing Provider  Prenatal Vit-Fe Fumarate-FA (PRENATAL  MULTIVITAMIN) TABS tablet Take 1 tablet by mouth daily at 12 noon.    Historical Provider, MD   BP 127/85 mmHg  Pulse 98  Temp(Src) 98 F (36.7 C) (Oral)  Resp 16  Ht 5\' 2"  (1.575 m)  Wt 150 lb (68.04 kg)  BMI 27.43 kg/m2  SpO2 100%  LMP 06/07/2015 Physical Exam  Constitutional: She is oriented to person, place, and time. She appears well-developed and well-nourished. No distress.  HENT:  Head: Normocephalic and atraumatic.  Eyes: Conjunctivae and EOM are normal.  Cardiovascular: Normal rate and regular rhythm.   Pulmonary/Chest: Effort normal and breath sounds normal. No stridor. No respiratory distress.  Abdominal: She exhibits no distension.    Musculoskeletal: She exhibits no edema.  Neurological: She is alert and oriented to person, place, and time. No cranial nerve deficit.  Skin: Skin is warm and dry.  Psychiatric: She has a normal mood and affect.  Nursing note and vitals reviewed.   ED Course  Procedures (including critical care time) Labs Review Labs Reviewed  COMPREHENSIVE METABOLIC PANEL - Abnormal; Notable for the following:    Glucose, Bld 118 (*)    All other components within normal limits  LIPASE, BLOOD - Abnormal; Notable for the following:    Lipase  268 (*)    All other components within normal limits  CBC WITH DIFFERENTIAL/PLATELET - Abnormal; Notable for the following:    WBC 12.2 (*)    Neutro Abs 10.5 (*)    All other components within normal limits  ETHANOL    EMR notable for labs earlier today; elevated lipase.   On repeat exam the patient remains in similar condition, with pain, but different pain medication, as she is breast feeding. Patient also has nausea.  MDM  Young female presents with new epigastric pain.  On exam the patient has tenderness to palpation about the epigastrium. Patient's evaluation as an outpatient, and again here is consistent with pancreatitis. Patient has no evidence for hepatobiliary dysfunction. She remains  persistent nauseous, with pain. Patient defers pain management, given her breast-feeding status. However, given her elevated lipase, persistent nausea, she was admitted for further evaluation, management.   Gerhard Munch, MD 07/11/15 2314

## 2015-07-11 NOTE — Progress Notes (Signed)
Pre visit review using our clinic review tool, if applicable. No additional management support is needed unless otherwise documented below in the visit note. 

## 2015-07-11 NOTE — Telephone Encounter (Signed)
Called patient spoke with her regarding her lab work. Lipase is elevated to 289. She continues to have epigastric discomfort. Given her elevated lipase and continued discomfort I discussed further evaluation for this issue given that she has acute pancreatitis. Discussed evaluation in the emergency room for IV fluids and determination of admission for monitoring. She agreed with this. [facility]. I called the charge nurse and informed them that the patient was on her way.

## 2015-07-11 NOTE — ED Notes (Signed)
Pt had lab work already drawn and resulted today, can see results in chart review

## 2015-07-11 NOTE — Assessment & Plan Note (Addendum)
Patient with onset of epigastric discomfort last night following eating peanut butter and celery. Pain has improved throughout the day. Minimal tenderness on exam though no peritoneal signs. No masses palpated. She does have a ventral hernia though this is fully reducible and nontender. Unlikely related to her hernia given location and benign exam at hernia site. Urine pregnancy test is negative. No urinary symptoms. No vaginal symptoms. No pelvic pain. Did have indigestion. No risk factors for heart disease. Burning discomfort was atypical. Suspect gastritis with reflux or food intolerance as possible causes. Potentially could be related to peptic ulcer disease, pancreatic pathology, gallbladder pathology, or liver pathology. We'll check CBC, CMP, and lipase. She can take Tums for any further discomfort. She is breast-feeding and on review of up-to-date it appears that Tums should be safe with breast-feeding. She'll continue to monitor. She will avoid peanut butter and peanuts. She is given return precautions.

## 2015-07-11 NOTE — ED Notes (Signed)
Pt states "last night i ate celery and peanut butter, 15-20 minutes after i was in terrible pain, couldn't sleep, stomach still hurts today". Went to PCP today who ran blood tests, called patient back which showed that she had pancreatitis. Pt in NAD at this time, pt c/o epigastric pain.

## 2015-07-12 ENCOUNTER — Observation Stay (HOSPITAL_COMMUNITY): Payer: BLUE CROSS/BLUE SHIELD

## 2015-07-12 DIAGNOSIS — K859 Acute pancreatitis without necrosis or infection, unspecified: Principal | ICD-10-CM

## 2015-07-12 DIAGNOSIS — R1013 Epigastric pain: Secondary | ICD-10-CM | POA: Diagnosis not present

## 2015-07-12 DIAGNOSIS — Z87891 Personal history of nicotine dependence: Secondary | ICD-10-CM | POA: Diagnosis not present

## 2015-07-12 MED ORDER — ONDANSETRON HCL 4 MG/2ML IJ SOLN: 4.0000 mg | Freq: Four times a day (QID) | INTRAMUSCULAR | Status: DC | PRN

## 2015-07-12 NOTE — ED Notes (Signed)
Patient transported to Ultrasound 

## 2015-07-12 NOTE — Progress Notes (Signed)
PROGRESS NOTE                                                                                                                                                                                                             Patient Demographics:    Renee Fleming, is a 30 y.o. female, DOB - [birth year], ZOX:[id number]  Admit date - 07/11/2015   Admitting Physician Hillary BowJared M Gardner, DO  Outpatient Primary MD for the patient is Wynona DoveWALKER,JENNIFER AZBELL, MD  LOS -   Outpatient Specialists: None  No chief complaint on file.  Brief Narrative   30 year old female currently breast-feeding presented to the ED with severe acute epigastric abdominal pain worsened with eating since one day duration. Reports nausea but no vomiting. Denied fever, chills, diarrhea, dysuria. Denies eating anything outside. She does drink a lot of last 1 and a (last drink was 3 days back). Denied smoking. In the ED she was found to have lipase in high 200s. LFTs normal. Ultrasound of the abdomen negative for gallstones or CBD dilatation.    Subjective:   Abdominal pain much better. Refuses pain meds   Assessment  & Plan :    Active Problems:   Acute Pancreatitis No clear etiology. alcohol use maybe contributed although claims to be drinking in moderation only. Lipase slowly trending down. LFTs normal. Ultrasound abdomen unremarkable. Clinically stable. Continue IV fluids. Refusing pain meds as she is still breast-feeding. Will try clears in the evening. Encouraged ambulation. Serial abdominal exam. Follow lipase in a.m.     Code Status : full code  Family Communication  : Husband at bedside  Disposition Plan  : Home in 1-2 days if continues to improve and tolerating advanced diet  Barriers For Discharge :  Active symptoms  Consults  : None  Procedures  : Ultrasound abdomen  DVT Prophylaxis  :  Lovenox -   Lab Results  Component Value Date   PLT 252 07/11/2015    Antibiotics  :   Anti-infectives    None        Objective:   Filed Vitals:   07/12/15 0037 07/12/15 0100 07/12/15 0500 07/12/15 1306  BP:  126/79 110/68 108/72  Pulse:  103 85 77  Temp: 98.3 F (36.8 C) 98.1 F (36.7 C) 98.3 F (36.8 C) 98.1 F (36.7 C)  TempSrc: Oral Oral  Oral Oral  Resp:  Height:      Weight:      SpO2:  100% 100% 100%    Wt Readings from Last 3 Encounters:  07/11/15 68.04 kg (150 lb)  07/11/15 66.044 kg (145 lb 9.6 oz)  06/14/15 66.225 kg (146 lb)     Intake/Output Summary (Last 24 hours) at 07/12/15 1418 Last data filed at 07/12/15 1300  Gross per 24 hour  Intake 1743.75 ml  Output    405 ml  Net 1338.75 ml     Physical Exam  Gen: not in distress HEENT: no pallor, moist mucosa, supple neck Chest: clear b/l, no added sounds CVS: N S1&S2, no murmurs, rubs or gallop GI: soft, NT, ND, BS+ Musculoskeletal: warm, no edema     Data Review:    CBC  Recent Labs Lab 07/11/15 1447 07/11/15 2141  WBC 8.4 12.2*  HGB 13.0 13.4  HCT 38.0 39.3  PLT 233.0 252  MCV 86.4 86.2  MCH  --  29.4  MCHC 34.2 34.1  RDW 13.2 12.4  LYMPHSABS  --  1.3  MONOABS  --  0.5  EOSABS  --  0.0  BASOSABS  --  0.0    Chemistries   Recent Labs Lab 07/11/15 1447 07/11/15 2141  NA 138 141  K 4.5 4.0  CL 104 107  CO2 28 22  GLUCOSE 98 118*  BUN 15 14  CREATININE 0.81 0.77  CALCIUM 9.8 10.0  AST 19 24  ALT 16 22  ALKPHOS 67 72  BILITOT 0.5 0.5   ------------------------------------------------------------------------------------------------------------------ No results for input(s): CHOL, HDL, LDLCALC, TRIG, CHOLHDL, LDLDIRECT in the last 72 hours.  No results found for: HGBA1C ------------------------------------------------------------------------------------------------------------------ No results for input(s): TSH, T4TOTAL, T3FREE, THYROIDAB in the last 72 hours.  Invalid input(s):  FREET3 ------------------------------------------------------------------------------------------------------------------ No results for input(s): VITAMINB12, FOLATE, FERRITIN, TIBC, IRON, RETICCTPCT in the last 72 hours.  Coagulation profile No results for input(s): INR, PROTIME in the last 168 hours.  No results for input(s): DDIMER in the last 72 hours.  Cardiac Enzymes No results for input(s): CKMB, TROPONINI, MYOGLOBIN in the last 168 hours.  Invalid input(s): CK ------------------------------------------------------------------------------------------------------------------ No results found for: BNP  Inpatient Medications  Scheduled Meds: . enoxaparin (LOVENOX) injection  40 mg Subcutaneous Daily   Continuous Infusions: . sodium chloride 125 mL/hr at 07/12/15 0815   PRN Meds:.  Micro Results No results found for this or any previous visit (from the past 240 hour(s)).  Radiology Reports US Abdomen Complete  07/12/2015  CLINICAL DATA:  Epigastric pain, onset last night. EXAM: ABDOMEN ULTRASOUND COMPLETE COMPARISON:  None. FINDINGS: Gallbladder: No conclusive calculi. Non shadowing nonmobile echogenic foci of the gallbladder wall may represent 3 mm polyps. No mural thickening or pericholecystic fluid. Common bile duct: Diameter: Normal, 1.7 mm. Liver: No focal lesion identified. Within normal limits in parenchymal echogenicity. IVC: No abnormality visualized. Pancreas: Visualized portion unremarkable. Spleen: Size and appearance within normal limits. Right Kidney: Length: 10.5 cm. Echogenicity within normal limits. No mass or hydronephrosis visualized. Left Kidney: Length: 11.9 cm. Echogenicity within normal limits. No mass or hydronephrosis visualized. Abdominal aorta: No aneurysm visualized. Other findings: None. IMPRESSION: Possible 3 mm gallbladder polyps. Otherwise unremarkable gallbladder, bile ducts, liver and pancreas. Electronically Signed   By: Ellery Plunk M.D.   On:  07/12/2015 00:39    Time Spent in minutes  25   Eddie North M.D on 07/12/2015 at 2:18 PM  Between 7am to 7pm -  Pager - [phone]  After 7pm go to [email or web address] - password [facility]  [facility] -  Office  [phone]

## 2015-07-13 ENCOUNTER — Telehealth: Payer: Self-pay | Admitting: Internal Medicine

## 2015-07-13 DIAGNOSIS — R1013 Epigastric pain: Secondary | ICD-10-CM

## 2015-07-13 LAB — BASIC METABOLIC PANEL
Anion gap: 12 (ref 5–15)
BUN: 6 mg/dL (ref 6–20)
CHLORIDE: 110 mmol/L (ref 101–111)
CO2: 19 mmol/L — ABNORMAL LOW (ref 22–32)
Calcium: 8.3 mg/dL — ABNORMAL LOW (ref 8.9–10.3)
Creatinine, Ser: 0.65 mg/dL (ref 0.44–1.00)
GFR calc Af Amer: >60 mL/min (ref >60)
GFR calc non Af Amer: >60 mL/min (ref >60)
Glucose, Bld: 79 mg/dL (ref 65–99)
POTASSIUM: 3.9 mmol/L (ref 3.5–5.1)
SODIUM: 141 mmol/L (ref 135–145)

## 2015-07-13 LAB — LIPASE, BLOOD: LIPASE: 46 U/L (ref 11–51)

## 2015-07-13 NOTE — Discharge Instructions (Signed)

## 2015-07-13 NOTE — Telephone Encounter (Signed)
HFU/ Pt is being discharged from the hospital today. Dx is Acute pancreatitis. Pt is scheduled on 07/18/2015 @ 7:30am. Thank you!

## 2015-07-13 NOTE — Discharge Summary (Signed)
Physician Discharge Summary  Renee Fleming HQI:[id number] DOB: [birth year] DOA: 07/11/2015  PCP: Wynona Dove, MD  Admit date: 07/11/2015 Discharge date: 07/13/2015  Time spent: 25  minutes  Recommendations for Outpatient Follow-up:  1. Home with PCP follow up in 1 week   Discharge Diagnoses:  Active Problems:   Abdominal pain, epigastric   Acute ncreatitis   Discharge Condition: fair  Diet recommendation: *low fat  Filed Weights   07/11/15 1910  Weight: 68.04 kg (150 lb)    History of present illness:  30 year old female currently breast-feeding presented to the ED with severe acute epigastric abdominal pain worsened with eating since one day duration. Reports nausea but no vomiting. Denied fever, chills, diarrhea, dysuria. Denies eating anything outside. She does drink a lot of last 1 and a (last drink was 3 days back). Denied smoking. In the ED she was found to have lipase in high 200s. LFTs normal. Ultrasound of the abdomen negative for gallstones or CBD dilatation.  Hospital Course:  Active Problems:  Acute Pancreatitis No clear etiology. alcohol use maybe contributed although claims to be drinking in moderation only. LFTs normal. Ultrasound abdomen unremarkable. Given IV fluids. Did not require pain medications. Lipase now normalized. Diet advanced slowly to low fat and tolerated well.   Stale for discharge home. counseled on avoiding alcohol.    Code Status : full code  Family Communication : Husband at bedside  Disposition Plan : Home    Consults : None  Procedures : Ultrasound abdomen     Recent Labs    Lab Results  Component Value Date   PLT 252 07/11/2015      Antibiotics :   Anti-infectives    None         Discharge Exam: Filed Vitals:   07/12/15 2100 07/13/15 0500  BP: 104/68 99/61  Pulse: 85 81  Temp: 98.6 F (37 C) 98 F (36.7 C)  Resp: 18 18    Gen: not in distress HEENT: no pallor, moist  mucosa, supple neck Chest: clear b/l, no added sounds CVS: N S1&S2, no murmurs, rubs or gallop GI: soft, NT, ND, BS+ Musculoskeletal: warm, no edema  Discharge Instructions    Current Discharge Medication List    CONTINUE these medications which have NOT CHANGED   Details  Prenatal Vit-Fe Fumarate-FA (PRENATAL MULTIVITAMIN) TABS tablet Take 1 tablet by mouth daily at 12 noon.       Allergies  Allergen Reactions  . Neosporin [Neomycin-Bacitracin Zn-Polymyx] Rash   Follow-up Information    Follow up with Wynona Dove, MD. Schedule an appointment as soon as possible for a visit in 1 week.   Specialty:  Internal Medicine   Contact information:   [address] [place] [address] [phone]        The results of significant diagnostics from this hospitalization (including imaging, microbiology, ancillary and laboratory) are listed below for reference.    Significant Diagnostic Studies: US Abdomen Complete  07/12/2015  CLINICAL DATA:  Epigastric pain, onset last night. EXAM: ABDOMEN ULTRASOUND COMPLETE COMPARISON:  None. FINDINGS: Gallbladder: No conclusive calculi. Non shadowing nonmobile echogenic foci of the gallbladder wall may represent 3 mm polyps. No mural thickening or pericholecystic fluid. Common bile duct: Diameter: Normal, 1.7 mm. Liver: No focal lesion identified. Within normal limits in parenchymal echogenicity. IVC: No abnormality visualized. Pancreas: Visualized portion unremarkable. Spleen: Size and appearance within normal limits. Right Kidney: Length: 10.5 cm. Echogenicity within normal limits. No mass or hydronephrosis visualized. Left Kidney:  Length: 11.9 cm. Echogenicity within normal limits. No mass or hydronephrosis visualized. Abdominal aorta: No aneurysm visualized. Other findings: None. IMPRESSION: Possible 3 mm gallbladder polyps. Otherwise unremarkable gallbladder, bile ducts, liver and pancreas. Electronically Signed   By:  Ellery Plunkaniel R Mitchell M.D.   On: 07/12/2015 00:39    Microbiology: No results found for this or any previous visit (from the past 240 hour(s)).   Labs: Basic Metabolic Panel:  Recent Labs Lab 07/11/15 1447 07/11/15 2141 07/13/15 0757  NA 138 141 141  K 4.5 4.0 3.9  CL 104 107 110  CO2 28 22 19*  GLUCOSE 98 118* 79  BUN 15 14 6   CREATININE 0.81 0.77 0.65  CALCIUM 9.8 10.0 8.3*   Liver Function Tests:  Recent Labs Lab 07/11/15 1447 07/11/15 2141  AST 19 24  ALT 16 22  ALKPHOS 67 72  BILITOT 0.5 0.5  PROT 7.4 7.8  ALBUMIN 4.8 4.8    Recent Labs Lab 07/11/15 1447 07/11/15 2141 07/13/15 0757  LIPASE 289.0* 268* 46   No results for input(s): AMMONIA in the last 168 hours. CBC:  Recent Labs Lab 07/11/15 1447 07/11/15 2141  WBC 8.4 12.2*  NEUTROABS  --  10.5*  HGB 13.0 13.4  HCT 38.0 39.3  MCV 86.4 86.2  PLT 233.0 252   Cardiac Enzymes: No results for input(s): CKTOTAL, CKMB, CKMBINDEX, TROPONINI in the last 168 hours. BNP: BNP (last 3 results) No results for input(s): BNP in the last 8760 hours.  ProBNP (last 3 results) No results for input(s): PROBNP in the last 8760 hours.  CBG: No results for input(s): GLUCAP in the last 168 hours.     Signed:  Eddie NorthHUNGEL, Elinor Kleine MD.  [facility] 07/13/2015, 12:24 PM

## 2015-07-13 NOTE — Telephone Encounter (Signed)
HFU does not require additional TCM follow up.  [employer] ins.

## 2015-07-18 ENCOUNTER — Encounter: Payer: Self-pay | Admitting: Internal Medicine

## 2015-07-18 ENCOUNTER — Ambulatory Visit (INDEPENDENT_AMBULATORY_CARE_PROVIDER_SITE_OTHER): Payer: BLUE CROSS/BLUE SHIELD | Admitting: Internal Medicine

## 2015-07-18 ENCOUNTER — Encounter: Payer: Self-pay | Admitting: *Deleted

## 2015-07-18 VITALS — BP 106/80 | HR 73 | Temp 97.7°F | Ht 62.0 in | Wt 141.5 lb

## 2015-07-18 DIAGNOSIS — K859 Acute pancreatitis without necrosis or infection, unspecified: Secondary | ICD-10-CM

## 2015-07-18 LAB — CBC WITH DIFFERENTIAL/PLATELET
BASOS ABS: 0 10*3/uL (ref 0.0–0.1)
Basophils Relative: 0.4 % (ref 0.0–3.0)
Eosinophils Absolute: 0 10*3/uL (ref 0.0–0.7)
Eosinophils Relative: 0 % (ref 0.0–5.0)
HEMATOCRIT: 38.3 % (ref 36.0–46.0)
Hemoglobin: 13.2 g/dL (ref 12.0–15.0)
LYMPHS ABS: 1.8 10*3/uL (ref 0.7–4.0)
LYMPHS PCT: 34 % (ref 12.0–46.0)
MCHC: 34.4 g/dL (ref 30.0–36.0)
MCV: 86.2 fl (ref 78.0–100.0)
MONOS PCT: 6.8 % (ref 3.0–12.0)
Monocytes Absolute: 0.4 10*3/uL (ref 0.1–1.0)
NEUTROS PCT: 58.8 % (ref 43.0–77.0)
Neutro Abs: 3.1 10*3/uL (ref 1.4–7.7)
Platelets: 250 10*3/uL (ref 150.0–400.0)
RBC: 4.44 Mil/uL (ref 3.87–5.11)
RDW: 12.5 % (ref 11.5–15.5)
WBC: 5.3 10*3/uL (ref 4.0–10.5)

## 2015-07-18 LAB — COMPREHENSIVE METABOLIC PANEL
ALBUMIN: 4.7 g/dL (ref 3.5–5.2)
ALK PHOS: 63 U/L (ref 39–117)
ALT: 13 U/L (ref 0–35)
AST: 14 U/L (ref 0–37)
BUN: 15 mg/dL (ref 6–23)
CALCIUM: 9.6 mg/dL (ref 8.4–10.5)
CO2: 27 mEq/L (ref 19–32)
Chloride: 104 mEq/L (ref 96–112)
Creatinine, Ser: 0.83 mg/dL (ref 0.40–1.20)
GFR: 84.81 mL/min (ref >60.00)
Glucose, Bld: 99 mg/dL (ref 70–99)
POTASSIUM: 4.2 meq/L (ref 3.5–5.1)
Sodium: 139 mEq/L (ref 135–145)
TOTAL PROTEIN: 7.4 g/dL (ref 6.0–8.3)
Total Bilirubin: 0.5 mg/dL (ref 0.2–1.2)

## 2015-07-18 LAB — LIPASE: LIPASE: 29 U/L (ref 11.0–59.0)

## 2015-07-18 NOTE — Assessment & Plan Note (Signed)
Recent episode of pancreatitis, unclear etiology. Question gallstone that may have passed. Reviewed notes and RUQ US. Will set up evaluation with Dr. Lemar LivingsByrnett. Question if repeat US versus HIDA scan might be helpful. Labs today to repeat CMP and lipase.

## 2015-07-18 NOTE — Progress Notes (Signed)
Subjective:    Patient ID: Renee Fleming, female    DOB: Mar 10, 1986, 30 y.o.   MRN: [id number]  HPI  29YO female presents for hospital follow up.  ADMITTED: 07/11/2015 DISCHARGED: 5/52017  DIAGNOSIS: Pancreatitis  Sudden onset of abdominal pain last Tuesday. Severe epigastric abdominal pain that radiated to back. Had diarrhea the week before with nausea. Started after eating at [employer].  No further abdominal pain. No NV. Tolerating a regular diet.   Wt Readings from Last 3 Encounters:  07/18/15 141 lb 8 oz (64.184 kg)  07/11/15 150 lb (68.04 kg)  07/11/15 145 lb 9.6 oz (66.044 kg)   BP Readings from Last 3 Encounters:  07/18/15 106/80  07/13/15 99/61  07/11/15 112/64    No past medical history on file. Family History  Problem Relation Age of Onset  . Cancer Paternal Grandmother     kidney   Past Surgical History  Procedure Laterality Date  . Wisdom tooth extraction    . Vaginal delivery      3x no complications   Social History   Social History  . Marital Status: Married    Spouse Name: N/A  . Number of Children: N/A  . Years of Education: N/A   Social History Main Topics  . Smoking status: Former Smoker    Quit date: 01/15/2008  . Smokeless tobacco: Never Used  . Alcohol Use: 1.0 oz/week    2 Standard drinks or equivalent per week  . Drug Use: No  . Sexual Activity: Yes    Birth Control/ Protection: None   Other Topics Concern  . None   Social History Narrative   Lives in [place] with husband and 2 daughters 4YO and 2YO (daycare)      Work - full time as Marine scientist.      Diet - regular      Exercise - 3-4x per week, runs 10-35mile/week    Review of Systems  Constitutional: Negative for fever, chills, appetite change, fatigue and unexpected weight change.  Eyes: Negative for visual disturbance.  Respiratory: Negative for shortness of breath.   Cardiovascular: Negative for chest pain and leg swelling.  Gastrointestinal:  Positive for nausea, abdominal pain and diarrhea. Negative for vomiting and constipation.  Skin: Negative for color change and rash.  Hematological: Negative for adenopathy. Does not bruise/bleed easily.  Psychiatric/Behavioral: Negative for dysphoric mood. The patient is not nervous/anxious.        Objective:    BP 106/80 mmHg  Pulse 73  Temp(Src) 97.7 F (36.5 C) (Oral)  Ht  (1.575 m)  Wt 141 lb 8 oz (64.184 kg)  BMI 25.87 kg/m2  SpO2 98% Physical Exam  Constitutional: She is oriented to person, place, and time. She appears well-developed and well-nourished. No distress.  HENT:  Head: Normocephalic and atraumatic.  Right Ear: External ear normal.  Left Ear: External ear normal.  Nose: Nose normal.  Mouth/Throat: Oropharynx is clear and moist. No oropharyngeal exudate.  Eyes: Conjunctivae and EOM are normal. Pupils are equal, round, and reactive to light. Right eye exhibits no discharge.  Neck: Normal range of motion. Neck supple. No thyromegaly present.  Cardiovascular: Normal rate, regular rhythm, normal heart sounds and intact distal pulses.  Exam reveals no gallop and no friction rub.   No murmur heard. Pulmonary/Chest: Effort normal. No respiratory distress. She has no wheezes. She has no rales.  Abdominal: Soft. Bowel sounds are normal. She exhibits no distension and no mass. There is tenderness (mild  RUQ). There is no rebound and no guarding.  Musculoskeletal: Normal range of motion. She exhibits no edema or tenderness.  Lymphadenopathy:    She has no cervical adenopathy.  Neurological: She is alert and oriented to person, place, and time. No cranial nerve deficit. Coordination normal.  Skin: Skin is warm and dry. No rash noted. She is not diaphoretic. No erythema. No pallor.  Psychiatric: She has a normal mood and affect. Her behavior is normal. Judgment and thought content normal.          Assessment & Plan:   Problem List Items Addressed This Visit       Unprioritized   Pancreatitis - Primary    Recent episode of pancreatitis, unclear etiology. Question gallstone that may have passed. Reviewed notes and RUQ US. Will set up evaluation with Dr. Lemar LivingsByrnett. Question if repeat US versus HIDA scan might be helpful. Labs today to repeat CMP and lipase.      Relevant Orders   Comprehensive metabolic panel   Lipase   Ambulatory referral to General Surgery   CBC w/Diff       Return in about 4 weeks (around 08/15/2015) for Recheck.  Ronna PolioJennifer Kasarah Sitts, MD Internal Medicine [facility]

## 2015-07-18 NOTE — Patient Instructions (Signed)
Labs today.  We will set up evaluation with Dr. Byrnett. 

## 2015-07-30 ENCOUNTER — Encounter: Payer: Self-pay | Admitting: General Surgery

## 2015-07-30 ENCOUNTER — Ambulatory Visit (INDEPENDENT_AMBULATORY_CARE_PROVIDER_SITE_OTHER): Payer: BLUE CROSS/BLUE SHIELD | Admitting: General Surgery

## 2015-07-30 VITALS — BP 118/74 | HR 78 | Resp 12 | Ht 62.0 in | Wt 137.0 lb

## 2015-07-30 DIAGNOSIS — Z8719 Personal history of other diseases of the digestive system: Secondary | ICD-10-CM

## 2015-07-30 NOTE — Progress Notes (Signed)
Patient ID: Renee Fleming, female   DOB: 11-Nov-1985, 30 y.o.   MRN: [id number]  Chief Complaint  Patient presents with  . Other    gallbladder    HPI Renee Fleming is a 30 y.o. female here for evaluation of her gallbladder. She had extreme nausea, severe diarrhea, and then developed pancreatitis on 07/13/15. The patient had just returned from vacation, and admitted that she had been enjoying adult beverages far more so than usual ( 4-5/ day).  Ultrasound did not show a gallstone, but suggested a small, 3 mm polyp. There was a concern for a passed common duct stone. She reports that now she only has heartburn with eating, markedly improved with the recent addition of omeprazole. She has no known history of elevated serum lipids.   She is scheduled for evaluation at [facility] for correction of a diastasis recti markedly accentuated during her recent pregnancy.  She delivered her third daughter eight months ago.   I personally reviewed the patient's history.   Daughter of Caryn Bee and Fredderick Severance.  HPI  Past Medical History  Diagnosis Date  . Pancreatitis   . Diastasis recti   . Umbilical hernia     Past Surgical History  Procedure Laterality Date  . Wisdom tooth extraction    . Vaginal delivery      3x no complications    Family History  Problem Relation Age of Onset  . Cancer Paternal Grandmother     kidney    Social History Social History  Substance Use Topics  . Smoking status: Former Smoker -- 5 years    Quit date: 01/15/2008  . Smokeless tobacco: Never Used  . Alcohol Use: 1.2 oz/week    2 Standard drinks or equivalent per week    Allergies  Allergen Reactions  . Neosporin [Neomycin-Bacitracin Zn-Polymyx] Rash    Current Outpatient Prescriptions  Medication Sig Dispense Refill  . Prenatal Vit-Fe Fumarate-FA (PRENATAL MULTIVITAMIN) TABS tablet Take 1 tablet by mouth daily at 12 noon.     No current facility-administered medications for  this visit.    Review of Systems Review of Systems  Constitutional: Negative.   Respiratory: Negative.   Cardiovascular: Negative.   Gastrointestinal: Positive for diarrhea. Negative for nausea, vomiting, abdominal pain, constipation, blood in stool, abdominal distention, anal bleeding and rectal pain.    Blood pressure 118/74, pulse 78, resp. rate 12, height  (1.575 m), weight 137 lb (62.143 kg), last menstrual period 07/07/2015, currently breastfeeding.  Physical Exam Physical Exam  Constitutional: She is oriented to person, place, and time. She appears well-developed and well-nourished.  Eyes: Conjunctivae are normal. No scleral icterus.  Neck: Neck supple.  Cardiovascular: Normal rate, regular rhythm and normal heart sounds.   Pulmonary/Chest: Effort normal and breath sounds normal.  Abdominal: Soft. Bowel sounds are normal. There is no tenderness.  Lymphadenopathy:    She has no cervical adenopathy.  Neurological: She is alert and oriented to person, place, and time.  Skin: Skin is warm and dry.  Psychiatric: She has a normal mood and affect.    Data Reviewed Jul 12, 2015 abdominal ultrasound was reviewed. 3 mm polyp noted. Negative Murphy sign. Labs notable for normal LFT's and modest ( <300) elevation of the serum lipase.  Lipase normal on recent repeat.    Assessment    History of pancreatitis. Recent pregnancy (8 months ago).      Plan    The patient has no known cholelithiasis at present. She may  have fallen victim of a perfect storm of alcohol ingestion, travel and dehydration.  She has been encouraged to gradually resume her regular diet. She has been asked to refrain from alcohol until June 1. If she has no recurrent symptoms, a repeat ultrasound in May 2018 to reassess the polyp would be appropriate. Should she develop postprandial symptoms menses none evident prior to this recent episode) we could discuss elective cholecystectomy.  As she is presently  asymptomatic, has no stones on ultrasound and is young, I think it's reasonable to have appeared of observation before jumping directly to gallbladder removal.  She will have lipid functions drawn today, even though his nonfasting. If they're normal nonfasting there is little to suggest that hyperlipidemia would be the source of her recent episode of pancreatitis.     PCP: Ronna PolioJennifer Walker This has been scribed by Sinda Duaryl-Lyn M Kennedy LPN    Earline MayotteByrnett, Isair Inabinet W 07/30/2015, 7:26 PM

## 2015-07-31 ENCOUNTER — Telehealth: Payer: Self-pay | Admitting: *Deleted

## 2015-07-31 LAB — LIPID PANEL
CHOL/HDL RATIO: 2.4 ratio (ref 0.0–4.4)
Cholesterol, Total: 125 mg/dL (ref 100–199)
HDL: 53 mg/dL (ref >39)
LDL Calculated: 56 mg/dL (ref 0–99)
TRIGLYCERIDES: 78 mg/dL (ref 0–149)
VLDL Cholesterol Cal: 16 mg/dL (ref 5–40)

## 2015-07-31 NOTE — Telephone Encounter (Signed)
-----   Message from Earline MayotteJeffrey W Byrnett, MD sent at 07/31/2015  9:47 AM EDT ----- Please notify the patient her lipid studies obtained yesterday were perfectly normal, especially considering she was nonfasting.  Follow-up as previously discussed.  Thank you    ----- Message -----    From: [facility] Lab Results In Interface    Sent: 07/31/2015   5:37 AM      To: Earline MayotteJeffrey W Byrnett, MD

## 2015-07-31 NOTE — Telephone Encounter (Signed)
Notified patient as instructed, patient pleased. Discussed follow-up appointments, patient agrees  

## 2015-11-08 ENCOUNTER — Encounter: Payer: Self-pay | Admitting: Obstetrics and Gynecology

## 2015-11-08 ENCOUNTER — Ambulatory Visit (INDEPENDENT_AMBULATORY_CARE_PROVIDER_SITE_OTHER): Payer: BLUE CROSS/BLUE SHIELD | Admitting: Obstetrics and Gynecology

## 2015-11-08 VITALS — BP 116/73 | HR 74 | Ht 62.0 in | Wt 115.7 lb

## 2015-11-08 DIAGNOSIS — B373 Candidiasis of vulva and vagina: Secondary | ICD-10-CM | POA: Diagnosis not present

## 2015-11-08 DIAGNOSIS — B3731 Acute candidiasis of vulva and vagina: Secondary | ICD-10-CM

## 2015-11-08 MED ORDER — TERCONAZOLE 0.4 % VA CREA: 1.0000 | TOPICAL_CREAM | Freq: Every day | VAGINAL | 2 refills | Status: DC

## 2015-11-08 NOTE — Patient Instructions (Signed)

## 2015-11-08 NOTE — Progress Notes (Signed)
CHIEF COMPLAINT/HPI:  30 y.o. female complains of white, thick, vaginal erythema noted and vulvar erythema noted vaginal discharge for 2  day(s). Denies abnormal vaginal bleeding, significant pelvic pain or fever. No UTI symptoms. Sexually active, does not use condoms, no change in partner.  Last unprotected intercourse _3_ days ago.  Denies history of known exposure to STD or symptoms in partner.  Patient's last menstrual period was 10/21/2015.  No history of STD's.  Review of Systems  Constitutional: Negative for fever and chills Eyes: Negative for visual disturbances Respiratory: Negative for shortness of breath, dyspnea Cardiovascular: Negative for chest pain or palpitations  Gastrointestinal: Negative for vomiting, diarrhea and constipation Genitourinary: Negative for dysuria and urgency Musculoskeletal: Negative for back pain, joint pain, myalgias  Neurological: Negative for dizziness and headaches    Past Medical History: Past Medical History:  Diagnosis Date  . Diastasis recti   . Pancreatitis   . Umbilical hernia     Past Surgical History: Past Surgical History:  Procedure Laterality Date  . VAGINAL DELIVERY     3x no complications  . WISDOM TOOTH EXTRACTION      Obstetrical History: OB History    Gravida Para Term Preterm AB Living   4 3 2   1 2    SAB TAB Ectopic Multiple Live Births   1     0 2      Obstetric Comments   Menstrual age: [age]  Age 1st Pregnancy:22       Gynecological History: Pertinent Gynecological History: Menses: regular every month without intermenstrual spotting Bleeding: NA Contraception: vasectomy DES exposure: denies Blood transfusions: none Sexually transmitted diseases: no past history Previous GYN Procedures: NA    Social History:Social History   Social History  . Marital status: Married    Spouse name: N/A  . Number of children: N/A  . Years of education: N/A   Social History Main Topics  . Smoking status: Former  Smoker    Years: 5.00    Quit date: 01/15/2008  . Smokeless tobacco: Never Used  . Alcohol use 1.2 oz/week    2 Standard drinks or equivalent per week  . Drug use: No  . Sexual activity: Yes    Birth control/ protection: None   Other Topics Concern  . None   Social History Narrative   [place] and 2 daughters 4YO and 2YO (daycare)      Work - full time as Marine scientistinsurance underwriter.      Diet - regular      Exercise - 3-4x per week, runs 10-2515mile/week    Family History: Family History  Problem Relation Age of Onset  . Cancer Paternal Grandmother     kidney    Allergies: Allergies  Allergen Reactions  . Neosporin [Neomycin-Bacitracin Zn-Polymyx] Rash        PHYSICAL EXAM: Pelvic - VULVA: normal appearing vulva with no masses, tenderness or lesions, vulvar erythema scattered, VAGINA: normal appearing vagina with normal color and discharge, no lesions, WET MOUNT done - results: negative for pathogens, normal epithelial cells, lactobacilli   Labs: No results found for this or any previous visit (from the past 24 hour(s)).   Assessment: Vaginal yeast infection Plan: Terazol 7 x 3 nights then prn RTC as needed. No orders of the defined types were placed in this encounter.    Renee Fleming

## 2016-02-07 ENCOUNTER — Encounter: Payer: Self-pay | Admitting: Obstetrics and Gynecology

## 2016-03-10 DIAGNOSIS — K824 Cholesterolosis of gallbladder: Secondary | ICD-10-CM

## 2016-03-10 HISTORY — DX: Cholesterolosis of gallbladder: K82.4

## 2016-04-28 ENCOUNTER — Encounter: Payer: BLUE CROSS/BLUE SHIELD | Admitting: Internal Medicine

## 2016-04-28 ENCOUNTER — Encounter: Payer: BLUE CROSS/BLUE SHIELD | Admitting: Family Medicine

## 2016-05-15 ENCOUNTER — Other Ambulatory Visit: Payer: Self-pay

## 2016-05-15 DIAGNOSIS — K824 Cholesterolosis of gallbladder: Secondary | ICD-10-CM

## 2016-05-22 ENCOUNTER — Encounter: Payer: BLUE CROSS/BLUE SHIELD | Admitting: Certified Nurse Midwife

## 2016-05-26 ENCOUNTER — Encounter: Payer: BLUE CROSS/BLUE SHIELD | Admitting: Family Medicine

## 2016-06-02 ENCOUNTER — Telehealth: Payer: Self-pay | Admitting: Family Medicine

## 2016-06-02 NOTE — Telephone Encounter (Signed)
Pt called stating that she feels more comfortable with a female provider. I switched pt to Renee Fleming.  Thank you!

## 2016-06-04 ENCOUNTER — Encounter: Payer: BLUE CROSS/BLUE SHIELD | Admitting: Obstetrics and Gynecology

## 2016-06-04 ENCOUNTER — Encounter: Payer: BLUE CROSS/BLUE SHIELD | Admitting: Family Medicine

## 2016-06-17 ENCOUNTER — Encounter: Payer: BLUE CROSS/BLUE SHIELD | Admitting: Obstetrics and Gynecology

## 2016-06-18 ENCOUNTER — Telehealth: Payer: Self-pay

## 2016-06-18 NOTE — Telephone Encounter (Signed)
Message left for patient regarding her new insurance. Need updated information so we can authorize her scheduled Ultrasound.

## 2016-07-09 ENCOUNTER — Encounter: Payer: Self-pay | Admitting: Family

## 2016-07-09 ENCOUNTER — Ambulatory Visit (INDEPENDENT_AMBULATORY_CARE_PROVIDER_SITE_OTHER): Payer: Managed Care, Other (non HMO) | Admitting: Family

## 2016-07-09 VITALS — BP 118/60 | HR 91 | Temp 98.6°F | Resp 12 | Ht 62.99 in | Wt 118.6 lb

## 2016-07-09 DIAGNOSIS — Z0000 Encounter for general adult medical examination without abnormal findings: Secondary | ICD-10-CM

## 2016-07-09 NOTE — Progress Notes (Signed)
Subjective:    Patient ID: Renee Fleming, female    DOB: [birth year], 31 y.o.   MRN: [id number]  CC: Renee Fleming is a 31 y.o. female who presents today for physical exam.    HPI: Feeling well. No complaints.   Here for paperwork for insurance . Sees OB GYN in 2 weeks where she will have physical, pap, and labs done per patient.   Following with Renee Fleming for 3mm polyp- has repeat CT scheduled      Colorectal Cancer Screening: No early family history Breast Cancer Screening: No early family history Cervical Cancer Screening: UTD per patient; follows with Renee Fleming, done 06/2015 per patient.  Bone Health screening/DEXA for 65+: No increased fracture risk. Defer screening at this time. Lung Cancer Screening: Doesn't have 30 year pack year history and age > 55 years.       Tetanus - UTD         Labs: Screening labs today. Exercise: Gets regular exercise.  Alcohol use:  rarely Smoking/tobacco use: Nonsmoker.  Regular dental exams:  Wears seat belt: Yes.   HISTORY:  Past Medical History:  Diagnosis Date  . Diastasis recti   . Pancreatitis   . Umbilical hernia     Past Surgical History:  Procedure Laterality Date  . VAGINAL DELIVERY     3x no complications  . WISDOM TOOTH EXTRACTION     Family History  Problem Relation Age of Onset  . Cancer Paternal Grandmother     kidney      ALLERGIES: Neosporin [neomycin-bacitracin zn-polymyx]  Current Outpatient Prescriptions on File Prior to Visit  Medication Sig Dispense Refill  . Prenatal Vit-Fe Fumarate-FA (PRENATAL MULTIVITAMIN) TABS tablet Take 1 tablet by mouth daily at 12 noon.     No current facility-administered medications on file prior to visit.     Social History  Substance Use Topics  . Smoking status: Former Smoker    Years: 5.00    Quit date: 01/15/2008  . Smokeless tobacco: Never Used  . Alcohol use 1.2 oz/week    2 Standard drinks or equivalent per week    Review of Systems    Constitutional: Negative for chills, fever and unexpected weight change.  HENT: Negative for congestion.   Respiratory: Negative for cough.   Cardiovascular: Negative for chest pain, palpitations and leg swelling.  Gastrointestinal: Negative for nausea and vomiting.  Musculoskeletal: Negative for arthralgias and myalgias.  Skin: Negative for rash.  Neurological: Negative for headaches.  Hematological: Negative for adenopathy.  Psychiatric/Behavioral: Negative for confusion.      Objective:    BP 118/60 (BP Location: Left Arm, Patient Position: Sitting, Cuff Size: Normal)   Pulse 91   Temp 98.6 F (37 C) (Oral)   Resp 12   Ht 5' 2.99" (1.6 m)   Wt 118 lb 9.6 oz (53.8 kg)   SpO2 97%   BMI 21.01 kg/m   BP Readings from Last 3 Encounters:  07/09/16 118/60  11/08/15 116/73  07/30/15 118/74   Wt Readings from Last 3 Encounters:  07/09/16 118 lb 9.6 oz (53.8 kg)  11/08/15 115 lb 11.2 oz (52.5 kg)  07/30/15 137 lb (62.1 kg)    Physical Exam  Constitutional: She appears well-developed and well-nourished.  Eyes: Conjunctivae are normal.  Neck: No thyroid mass and no thyromegaly present.  Cardiovascular: Normal rate, regular rhythm, normal heart sounds and normal pulses.   Pulmonary/Chest: Effort normal and breath sounds normal. She has no wheezes. She has no  rhonchi. She has no rales.  Lymphadenopathy:       Head (right side): No submental, no submandibular, no tonsillar, no preauricular, no posterior auricular and no occipital adenopathy present.       Head (left side): No submental, no submandibular, no tonsillar, no preauricular, no posterior auricular and no occipital adenopathy present.    She has no cervical adenopathy.  Neurological: She is alert.  Skin: Skin is warm and dry.  Psychiatric: She has a normal mood and affect. Her speech is normal and behavior is normal. Thought content normal.  Vitals reviewed.      Assessment & Plan:   Problem List Items Addressed  This Visit      Other   Routine physical examination - Primary    Patient sees OB/GYN for routine physical in 2 weeks and politely declines clinical breast exam. She is due for her Pap will have this done with her OB/GYN. She also declines any screening labs as also be done with her OB/GYN. Primarily she is here for health insurance and would like a form completed which I have done today. She is up-to-date on immunizations. Encouraged continued exercise.          I have discontinued Renee Fleming's terconazole. I am also having her maintain her prenatal multivitamin.   No orders of the defined types were placed in this encounter.   Return precautions given.   Risks, benefits, and alternatives of the medications and treatment plan prescribed today were discussed, and patient expressed understanding.   Education regarding symptom management and diagnosis given to patient on AVS.   Continue to follow with Renee Plowman, FNP for routine health maintenance.   Renee Fleming and I agreed with plan.   Renee Plowman, FNP

## 2016-07-09 NOTE — Patient Instructions (Signed)
Pleasure meeting you  Let me know if you need anything  Health Maintenance, Female Adopting a healthy lifestyle and getting preventive care can go a long way to promote health and wellness. Talk with your health care provider about what schedule of regular examinations is right for you. This is a good chance for you to check in with your provider about disease prevention and staying healthy. In between checkups, there are plenty of things you can do on your own. Experts have done a lot of research about which lifestyle changes and preventive measures are most likely to keep you healthy. Ask your health care provider for more information. Weight and diet Eat a healthy diet  Be sure to include plenty of vegetables, fruits, low-fat dairy products, and lean protein.  Do not eat a lot of foods high in solid fats, added sugars, or salt.  Get regular exercise. This is one of the most important things you can do for your health.  Most adults should exercise for at least 150 minutes each week. The exercise should increase your heart rate and make you sweat (moderate-intensity exercise).  Most adults should also do strengthening exercises at least twice a week. This is in addition to the moderate-intensity exercise. Maintain a healthy weight  Body mass index (BMI) is a measurement that can be used to identify possible weight problems. It estimates body fat based on height and weight. Your health care provider can help determine your BMI and help you achieve or maintain a healthy weight.  For females 20 years of age and older:  A BMI below 18.5 is considered underweight.  A BMI of 18.5 to 24.9 is normal.  A BMI of 25 to 29.9 is considered overweight.  A BMI of 30 and above is considered obese. Watch levels of cholesterol and blood lipids  You should start having your blood tested for lipids and cholesterol at 33 years of age, then have this test every 5 years.  You may need to have your  cholesterol levels checked more often if:  Your lipid or cholesterol levels are high.  You are older than 33 years of age.  You are at high risk for heart disease. Cancer screening Lung Cancer  Lung cancer screening is recommended for adults 55-80 years old who are at high risk for lung cancer because of a history of smoking.  A yearly low-dose CT scan of the lungs is recommended for people who:  Currently smoke.  Have quit within the past 15 years.  Have at least a 30-pack-year history of smoking. A pack year is smoking an average of one pack of cigarettes a day for 1 year.  Yearly screening should continue until it has been 15 years since you quit.  Yearly screening should stop if you develop a health problem that would prevent you from having lung cancer treatment. Breast Cancer  Practice breast self-awareness. This means understanding how your breasts normally appear and feel.  It also means doing regular breast self-exams. Let your health care provider know about any changes, no matter how small.  If you are in your 20s or 30s, you should have a clinical breast exam (CBE) by a health care provider every 1-3 years as part of a regular health exam.  If you are 40 or older, have a CBE every year. Also consider having a breast X-ray (mammogram) every year.  If you have a family history of breast cancer, talk to your health care provider about genetic   screening.  If you are at high risk for breast cancer, talk to your health care provider about having an MRI and a mammogram every year.  Breast cancer gene (BRCA) assessment is recommended for women who have family members with BRCA-related cancers. BRCA-related cancers include:  Breast.  Ovarian.  Tubal.  Peritoneal cancers.  Results of the assessment will determine the need for genetic counseling and BRCA1 and BRCA2 testing. Cervical Cancer  Your health care provider may recommend that you be screened regularly for  cancer of the pelvic organs (ovaries, uterus, and vagina). This screening involves a pelvic examination, including checking for microscopic changes to the surface of your cervix (Pap test). You may be encouraged to have this screening done every 3 years, beginning at age 34.  For women ages 27-65, health care providers may recommend pelvic exams and Pap testing every 3 years, or they may recommend the Pap and pelvic exam, combined with testing for human papilloma virus (HPV), every 5 years. Some types of HPV increase your risk of cervical cancer. Testing for HPV may also be done on women of any age with unclear Pap test results.  Other health care providers may not recommend any screening for nonpregnant women who are considered low risk for pelvic cancer and who do not have symptoms. Ask your health care provider if a screening pelvic exam is right for you.  If you have had past treatment for cervical cancer or a condition that could lead to cancer, you need Pap tests and screening for cancer for at least 20 years after your treatment. If Pap tests have been discontinued, your risk factors (such as having a new sexual partner) need to be reassessed to determine if screening should resume. Some women have medical problems that increase the chance of getting cervical cancer. In these cases, your health care provider may recommend more frequent screening and Pap tests. Colorectal Cancer  This type of cancer can be detected and often prevented.  Routine colorectal cancer screening usually begins at 33 years of age and continues through 33 years of age.  Your health care provider may recommend screening at an earlier age if you have risk factors for colon cancer.  Your health care provider may also recommend using home test kits to check for hidden blood in the stool.  A small camera at the end of a tube can be used to examine your colon directly (sigmoidoscopy or colonoscopy). This is done to check for  the earliest forms of colorectal cancer.  Routine screening usually begins at age 102.  Direct examination of the colon should be repeated every 5-10 years through 33 years of age. However, you may need to be screened more often if early forms of precancerous polyps or small growths are found. Skin Cancer  Check your skin from head to toe regularly.  Tell your health care provider about any new moles or changes in moles, especially if there is a change in a mole's shape or color.  Also tell your health care provider if you have a mole that is larger than the size of a pencil eraser.  Always use sunscreen. Apply sunscreen liberally and repeatedly throughout the day.  Protect yourself by wearing long sleeves, pants, a wide-brimmed hat, and sunglasses whenever you are outside. Heart disease, diabetes, and high blood pressure  High blood pressure causes heart disease and increases the risk of stroke. High blood pressure is more likely to develop in:  People who have blood pressure  in the high end of the normal range (130-139/85-89 mm Hg).  People who are overweight or obese.  People who are African American.  If you are 79-59 years of age, have your blood pressure checked every 3-5 years. If you are 81 years of age or older, have your blood pressure checked every year. You should have your blood pressure measured twice-once when you are at a hospital or clinic, and once when you are not at a hospital or clinic. Record the average of the two measurements. To check your blood pressure when you are not at a hospital or clinic, you can use:  An automated blood pressure machine at a pharmacy.  A home blood pressure monitor.  If you are between 56 years and 71 years old, ask your health care provider if you should take aspirin to prevent strokes.  Have regular diabetes screenings. This involves taking a blood sample to check your fasting blood sugar level.  If you are at a normal weight and  have a low risk for diabetes, have this test once every three years after 33 years of age.  If you are overweight and have a high risk for diabetes, consider being tested at a younger age or more often. Preventing infection Hepatitis B  If you have a higher risk for hepatitis B, you should be screened for this virus. You are considered at high risk for hepatitis B if:  You were born in a country where hepatitis B is common. Ask your health care provider which countries are considered high risk.  Your parents were born in a high-risk country, and you have not been immunized against hepatitis B (hepatitis B vaccine).  You have HIV or AIDS.  You use needles to inject street drugs.  You live with someone who has hepatitis B.  You have had sex with someone who has hepatitis B.  You get hemodialysis treatment.  You take certain medicines for conditions, including cancer, organ transplantation, and autoimmune conditions. Hepatitis C  Blood testing is recommended for:  Everyone born from [birth year] through 1967.  Anyone with known risk factors for hepatitis C. Sexually transmitted infections (STIs)  You should be screened for sexually transmitted infections (STIs) including gonorrhea and chlamydia if:  You are sexually active and are younger than 33 years of age.  You are older than 33 years of age and your health care provider tells you that you are at risk for this type of infection.  Your sexual activity has changed since you were last screened and you are at an increased risk for chlamydia or gonorrhea. Ask your health care provider if you are at risk.  If you do not have HIV, but are at risk, it may be recommended that you take a prescription medicine daily to prevent HIV infection. This is called pre-exposure prophylaxis (PrEP). You are considered at risk if:  You are sexually active and do not regularly use condoms or know the HIV status of your partner(s).  You take drugs by  injection.  You are sexually active with a partner who has HIV. Talk with your health care provider about whether you are at high risk of being infected with HIV. If you choose to begin PrEP, you should first be tested for HIV. You should then be tested every 3 months for as long as you are taking PrEP. Pregnancy  If you are premenopausal and you may become pregnant, ask your health care provider about preconception counseling.  If you  may become pregnant, take 400 to 800 micrograms (mcg) of folic acid every day.  If you want to prevent pregnancy, talk to your health care provider about birth control (contraception). Osteoporosis and menopause  Osteoporosis is a disease in which the bones lose minerals and strength with aging. This can result in serious bone fractures. Your risk for osteoporosis can be identified using a bone density scan.  If you are 68 years of age or older, or if you are at risk for osteoporosis and fractures, ask your health care provider if you should be screened.  Ask your health care provider whether you should take a calcium or vitamin D supplement to lower your risk for osteoporosis.  Menopause may have certain physical symptoms and risks.  Hormone replacement therapy may reduce some of these symptoms and risks. Talk to your health care provider about whether hormone replacement therapy is right for you. Follow these instructions at home:  Schedule regular health, dental, and eye exams.  Stay current with your immunizations.  Do not use any tobacco products including cigarettes, chewing tobacco, or electronic cigarettes.  If you are pregnant, do not drink alcohol.  If you are breastfeeding, limit how much and how often you drink alcohol.  Limit alcohol intake to no more than 1 drink per day for nonpregnant women. One drink equals 12 ounces of beer, 5 ounces of wine, or 1 ounces of hard liquor.  Do not use street drugs.  Do not share needles.  Ask  your health care provider for help if you need support or information about quitting drugs.  Tell your health care provider if you often feel depressed.  Tell your health care provider if you have ever been abused or do not feel safe at home. This information is not intended to replace advice given to you by your health care provider. Make sure you discuss any questions you have with your health care provider. Document Released: 09/09/2010 Document Revised: 08/02/2015 Document Reviewed: 11/28/2014 Elsevier Interactive Patient Education  2017 [employer].

## 2016-07-09 NOTE — Progress Notes (Signed)
Pre-visit discussion using our clinic review tool. No additional management support is needed unless otherwise documented below in the visit note.  

## 2016-07-10 ENCOUNTER — Encounter: Payer: Self-pay | Admitting: Family

## 2016-07-10 NOTE — Assessment & Plan Note (Addendum)
Patient sees OB/GYN for routine physical in 2 weeks and politely declines clinical breast exam. She is due for her Pap will have this done with her OB/GYN. She also declines any screening labs as also be done with her OB/GYN. Primarily she is here for health insurance and would like a form completed which I have done today. She is up-to-date on immunizations. Encouraged continued exercise.

## 2016-07-14 ENCOUNTER — Telehealth: Payer: Self-pay | Admitting: General Surgery

## 2016-07-14 NOTE — Telephone Encounter (Signed)
PATIENT CALL AND IS CURRENTLY SCHEDULED FOR A GALLBLADDER U/S ON 07-16-16 & TO RETURN TO SEE DR Lemar LivingsBYRNETT 07-30-16. SHE WAS CHECKING TO SEE IF SHE NEEDS TO HAVE THE U/S BECAUSE SHE HAS NOT HAD ANY PROBLEMS AND REFERRERS  NOT TO UNLESS ABSOLUTELY NECESSARY.PLEASE ADVISE.

## 2016-07-16 ENCOUNTER — Ambulatory Visit: Payer: Managed Care, Other (non HMO)

## 2016-07-21 NOTE — Telephone Encounter (Signed)
PATIENT CALLED TODAY AND HAD NOT HEARD BACK ABOUT HAVING THE GB U/S RESCHEDULED FOR 07-22-16 & TO SEE DR BYRNETT ON 07-30-16.SHE HAS CANCELLED HER U/S APPOINTMENT & APPOINTMENT WITH US UNTIL SHE HEARS BACK FROM DR BYRNETT. PLEASE ADVISE.

## 2016-07-21 NOTE — Telephone Encounter (Signed)
Patient was identified with a 3 mm polyp on ultrasound last year when she had her episode of pancreatitis. Repeat ultrasound had been recommended to determine if the polyp was enlarging in size. The likelihood of malignancy.  Patient encouraged to call back to discuss.

## 2016-07-22 ENCOUNTER — Ambulatory Visit: Payer: Managed Care, Other (non HMO)

## 2016-07-30 ENCOUNTER — Ambulatory Visit: Payer: Self-pay | Admitting: General Surgery

## 2016-08-06 ENCOUNTER — Telehealth: Payer: Self-pay | Admitting: *Deleted

## 2016-08-06 NOTE — Telephone Encounter (Signed)
Gallbladder ultrasound rescheduled for 08-13-16 at 10:15 am (arrive 10 am). Prep: NPO after midnight. Office appointment scheduled for 08-20-16 at 9:45 am. Patient aware.

## 2016-08-06 NOTE — Telephone Encounter (Signed)
We had called patient to reschedule appointment/ultrasound. She states that financially she would hesitant in having the ultrasound but the chance that the polyp could growing and possibly malignant she wants to have this done. I told her that the chances were low but that the only way was to do the ultrasound to see if there had been a change. Future ultrasounds would be decided later. Further discussion by Dr Renee Fleming would be based on ultrasound results. She appreciated phone call with explanation. She would like the appointments to be within the next 2 weeks if possible since she will be going back to work. Thanks.

## 2016-08-13 ENCOUNTER — Ambulatory Visit: Payer: Managed Care, Other (non HMO)

## 2016-08-18 ENCOUNTER — Ambulatory Visit: Payer: Managed Care, Other (non HMO)

## 2016-08-20 ENCOUNTER — Ambulatory Visit: Payer: Self-pay | Admitting: General Surgery

## 2016-08-21 ENCOUNTER — Ambulatory Visit (INDEPENDENT_AMBULATORY_CARE_PROVIDER_SITE_OTHER): Payer: BLUE CROSS/BLUE SHIELD | Admitting: Obstetrics and Gynecology

## 2016-08-21 ENCOUNTER — Encounter: Payer: Self-pay | Admitting: Obstetrics and Gynecology

## 2016-08-21 VITALS — BP 127/82 | HR 83 | Ht 62.0 in | Wt 118.5 lb

## 2016-08-21 DIAGNOSIS — Z01419 Encounter for gynecological examination (general) (routine) without abnormal findings: Secondary | ICD-10-CM

## 2016-08-21 DIAGNOSIS — M6208 Separation of muscle (nontraumatic), other site: Secondary | ICD-10-CM

## 2016-08-21 NOTE — Progress Notes (Signed)
Subjective:   Renee Fleming is a 31 y.o. [id number] Caucasian female here for a routine well-woman exam.  Patient's last menstrual period was 08/09/2016.    Current complaints: none PCP: Arnette       Doesn't need labs  Social History: Sexual: heterosexual Marital Status: married Living situation: with family Occupation: FT at [employer] Tobacco/alcohol: no tobacco use Illicit drugs: no history of illicit drug use  The following portions of the patient's history were reviewed and updated as appropriate: allergies, current medications, past family history, past medical history, past social history, past surgical history and problem list.  Past Medical History Past Medical History:  Diagnosis Date  . Diastasis recti   . Pancreatitis   . Umbilical hernia     Past Surgical History Past Surgical History:  Procedure Laterality Date  . VAGINAL DELIVERY     3x no complications  . WISDOM TOOTH EXTRACTION      Gynecologic History [id number]  Patient's last menstrual period was 08/09/2016. Contraception: vasectomy Last Pap: 2017. Results were: normal   Obstetric History OB History  Gravida Para Term Preterm AB Living  4 3 2   1 2   SAB TAB Ectopic Multiple Live Births  1     0 2    # Outcome Date GA Lbr Len/2nd Weight Sex Delivery Anes PTL Lv  4 Term 11/22/14 5050w4d  7 lb 9.3 oz (3.44 kg) F Vag-Spont None    3 Term 2012   8 lb 9 oz (3.884 kg) F Vag-Spont   LIV  2 Para 2010    F Vag-Spont   LIV  1 SAB             Obstetric Comments  Menstrual age: [age]    Age 1st Pregnancy:22    Current Medications No current outpatient prescriptions on file prior to visit.   No current facility-administered medications on file prior to visit.     Review of Systems Patient denies any headaches, blurred vision, shortness of breath, chest pain, abdominal pain, problems with bowel movements, urination, or intercourse.  Objective:  BP 127/82   Pulse 83   Ht 5\' 2"  (1.575 m)   Wt 118 lb  8 oz (53.8 kg)   LMP 08/09/2016   BMI 21.67 kg/m  Physical Exam  General:  Well developed, well nourished, no acute distress. She is alert and oriented x3. Skin:  Warm and dry Neck:  Midline trachea, no thyromegaly or nodules Cardiovascular: Regular rate and rhythm, no murmur heard Lungs:  Effort normal, all lung fields clear to auscultation bilaterally Breasts:  No dominant palpable mass, retraction, or nipple discharge Abdomen:  Soft, non tender, no hepatosplenomegaly or masses, 2 FB diastasis rectus still noted with excess abdominal skin. Pelvic:  External genitalia is normal in appearance.  The vagina is normal in appearance. The cervix is bulbous, no CMT.  Thin prep pap is not done . Uterus is felt to be normal size, shape, and contour.  No adnexal masses or tenderness noted. Extremities:  No swelling or varicosities noted Psych:  She has a normal mood and affect  Assessment:   Healthy well-woman exam Rectus Diastasis  Plan:  Recommend evaluation by surgeon for repair, will look into it and let me know where she'd like the referral sent to F/U 1 year for AE, or sooner if needed   Chandy Tarman Suzan NailerN Remy Voiles, CNM

## 2016-08-21 NOTE — Patient Instructions (Signed)
Preventive Care 18-39 Years, Female Preventive care refers to lifestyle choices and visits with your health care provider that can promote health and wellness. What does preventive care include?  A yearly physical exam. This is also called an annual well check.  Dental exams once or twice a year.  Routine eye exams. Ask your health care provider how often you should have your eyes checked.  Personal lifestyle choices, including: ? Daily care of your teeth and gums. ? Regular physical activity. ? Eating a healthy diet. ? Avoiding tobacco and drug use. ? Limiting alcohol use. ? Practicing safe sex. ? Taking vitamin and mineral supplements as recommended by your health care provider. What happens during an annual well check? The services and screenings done by your health care provider during your annual well check will depend on your age, overall health, lifestyle risk factors, and family history of disease. Counseling Your health care provider may ask you questions about your:  Alcohol use.  Tobacco use.  Drug use.  Emotional well-being.  Home and relationship well-being.  Sexual activity.  Eating habits.  Work and work Statistician.  Method of birth control.  Menstrual cycle.  Pregnancy history.  Screening You may have the following tests or measurements:  Height, weight, and BMI.  Diabetes screening. This is done by checking your blood sugar (glucose) after you have not eaten for a while (fasting).  Blood pressure.  Lipid and cholesterol levels. These may be checked every 5 years starting at age 38.  Skin check.  Hepatitis C blood test.  Hepatitis B blood test.  Sexually transmitted disease (STD) testing.  BRCA-related cancer screening. This may be done if you have a family history of breast, ovarian, tubal, or peritoneal cancers.  Pelvic exam and Pap test. This may be done every 3 years starting at age 38. Starting at age 30, this may be done  every 5 years if you have a Pap test in combination with an HPV test.  Discuss your test results, treatment options, and if necessary, the need for more tests with your health care provider. Vaccines Your health care provider may recommend certain vaccines, such as:  Influenza vaccine. This is recommended every year.  Tetanus, diphtheria, and acellular pertussis (Tdap, Td) vaccine. You may need a Td booster every 10 years.  Varicella vaccine. You may need this if you have not been vaccinated.  HPV vaccine. If you are 39 or younger, you may need three doses over 6 months.  Measles, mumps, and rubella (MMR) vaccine. You may need at least one dose of MMR. You may also need a second dose.  Pneumococcal 13-valent conjugate (PCV13) vaccine. You may need this if you have certain conditions and were not previously vaccinated.  Pneumococcal polysaccharide (PPSV23) vaccine. You may need one or two doses if you smoke cigarettes or if you have certain conditions.  Meningococcal vaccine. One dose is recommended if you are age 68-21 years and a first-year college student living in a residence hall, or if you have one of several medical conditions. You may also need additional booster doses.  Hepatitis A vaccine. You may need this if you have certain conditions or if you travel or work in places where you may be exposed to hepatitis A.  Hepatitis B vaccine. You may need this if you have certain conditions or if you travel or work in places where you may be exposed to hepatitis B.  Haemophilus influenzae type b (Hib) vaccine. You may need this  if you have certain risk factors.  Talk to your health care provider about which screenings and vaccines you need and how often you need them. This information is not intended to replace advice given to you by your health care provider. Make sure you discuss any questions you have with your health care provider. Document Released: 04/22/2001 Document Revised:  11/14/2015 Document Reviewed: 12/26/2014 Elsevier Interactive Patient Education  2017 Elsevier Inc.  

## 2016-09-01 ENCOUNTER — Encounter: Payer: Self-pay | Admitting: Obstetrics and Gynecology

## 2016-11-24 ENCOUNTER — Ambulatory Visit
Admission: RE | Admit: 2016-11-24 | Discharge: 2016-11-24 | Disposition: A | Discharge status: Home / Self Care | Payer: BLUE CROSS/BLUE SHIELD | Source: Ambulatory Visit | Attending: General Surgery | Admitting: General Surgery

## 2016-11-24 DIAGNOSIS — K824 Cholesterolosis of gallbladder: Secondary | ICD-10-CM

## 2016-12-01 ENCOUNTER — Encounter: Payer: Self-pay | Admitting: Family

## 2016-12-04 ENCOUNTER — Ambulatory Visit (INDEPENDENT_AMBULATORY_CARE_PROVIDER_SITE_OTHER): Payer: BLUE CROSS/BLUE SHIELD | Admitting: General Surgery

## 2016-12-04 ENCOUNTER — Encounter: Payer: Self-pay | Admitting: General Surgery

## 2016-12-04 VITALS — BP 112/68 | HR 88 | Resp 12 | Ht 62.0 in | Wt 120.0 lb

## 2016-12-04 DIAGNOSIS — Z8719 Personal history of other diseases of the digestive system: Secondary | ICD-10-CM

## 2016-12-04 NOTE — Progress Notes (Signed)
Patient ID: Renee Fleming, female   DOB: 1985/06/28, 31 y.o.   MRN: [id number]  Chief Complaint  Patient presents with  . Follow-up    HPI Renee Fleming is a 31 y.o. female.  Here for one year follow up gall bladder polyp that was discovered during an episode of pancreatitis. Her ultrasound was done 11-24-16. Denies GI issues at this time.   HPI  Past Medical History:  Diagnosis Date  . Diastasis recti   . Gallbladder polyp 2018   resolved; not seen on RUQ [place] 2018; per brynett, no further imaging unless symptoms  . Pancreatitis   . Umbilical hernia     Past Surgical History:  Procedure Laterality Date  . VAGINAL DELIVERY     3x no complications  . WISDOM TOOTH EXTRACTION      Family History  Problem Relation Age of Onset  . Cancer Paternal Grandmother        kidney    Social History Social History  Substance Use Topics  . Smoking status: Former Smoker    Years: 5.00    Quit date: 01/15/2008  . Smokeless tobacco: Never Used  . Alcohol use 1.2 oz/week    2 Standard drinks or equivalent per week    Allergies  Allergen Reactions  . Neosporin [Neomycin-Bacitracin Zn-Polymyx] Rash    No current outpatient prescriptions on file.   No current facility-administered medications for this visit.     Review of Systems Review of Systems  Constitutional: Negative.   Respiratory: Negative.   Cardiovascular: Negative.   Gastrointestinal: Negative for constipation, diarrhea, nausea and vomiting.    Blood pressure 112/68, pulse 88, resp. rate 12, height  (1.575 m), weight 120 lb (54.4 kg), last menstrual period 11/20/2016, currently breastfeeding.  Physical Exam Physical Exam  Constitutional: She is oriented to person, place, and time. She appears well-developed and well-nourished.  Abdominal: Soft. Normal appearance and bowel sounds are normal. There is no tenderness. A hernia is present.    diastasis recti.  Neurological: She is alert and  oriented to person, place, and time.  Skin: Skin is warm and dry.  Psychiatric: Her behavior is normal.    Data Reviewed 11/24/2016 abdominal ultrasound reviewed. No evidence of gallbladder polyp.  Assessment    Benign assessment of the hepatobiliary tree.  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 No recurrence.    Plan         Follow up as needed. The patient is aware to call back for any questions or new concerns.    HPI, Physical Exam, Assessment and Plan have been scribed under the direction and in the presence of Earline Mayotte, MD. Dorathy Daft, RN  I have completed the exam and reviewed the above documentation for accuracy and completeness.  I agree with the above.  Museum/gallery conservator has been used and any errors in dictation or transcription are unintentional.  Donnalee Curry, M.D., F.A.C.S.  Earline Mayotte 12/04/2016, 9:01 PM

## 2016-12-04 NOTE — Patient Instructions (Addendum)
The patient is aware to call back for any questions or new concerns.  

## 2017-03-09 ENCOUNTER — Encounter: Payer: Self-pay | Admitting: Family

## 2017-04-16 ENCOUNTER — Ambulatory Visit: Payer: BLUE CROSS/BLUE SHIELD | Admitting: Family Medicine

## 2017-04-16 DIAGNOSIS — Z0289 Encounter for other administrative examinations: Secondary | ICD-10-CM

## 2017-08-11 DIAGNOSIS — L538 Other specified erythematous conditions: Secondary | ICD-10-CM | POA: Diagnosis not present

## 2017-08-11 DIAGNOSIS — D225 Melanocytic nevi of trunk: Secondary | ICD-10-CM | POA: Diagnosis not present

## 2017-08-11 DIAGNOSIS — D2272 Melanocytic nevi of left lower limb, including hip: Secondary | ICD-10-CM | POA: Diagnosis not present

## 2017-08-11 DIAGNOSIS — D2261 Melanocytic nevi of right upper limb, including shoulder: Secondary | ICD-10-CM | POA: Diagnosis not present

## 2017-08-11 DIAGNOSIS — D2262 Melanocytic nevi of left upper limb, including shoulder: Secondary | ICD-10-CM | POA: Diagnosis not present

## 2017-08-11 DIAGNOSIS — B078 Other viral warts: Secondary | ICD-10-CM | POA: Diagnosis not present

## 2017-08-11 DIAGNOSIS — L298 Other pruritus: Secondary | ICD-10-CM | POA: Diagnosis not present

## 2017-08-18 ENCOUNTER — Ambulatory Visit: Payer: BLUE CROSS/BLUE SHIELD | Admitting: Certified Nurse Midwife

## 2017-08-18 VITALS — BP 136/87 | HR 84 | Ht 62.0 in | Wt 123.2 lb

## 2017-08-18 DIAGNOSIS — T192XXA Foreign body in vulva and vagina, initial encounter: Secondary | ICD-10-CM | POA: Diagnosis not present

## 2017-08-18 NOTE — Progress Notes (Signed)
GYN ENCOUNTER NOTE  Subjective:       Renee Fleming is a 32 y.o. [phone] female is here for gynecologic evaluation of the following issues:  1. States she had a retained tampon that she removed herself this afternoon. She states that it was in place for 2 days. She noticed it when she smelled an odor. With removal she states she had a gush of red brown discharge.   She denies fever or abdminal tenderness. She denies N &V or diarrhea.    Gynecologic History Patient's last menstrual period was 08/13/2017 (exact date).   Obstetric History OB History  Gravida Para Term Preterm AB Living  4 3 2   1 2   SAB TAB Ectopic Multiple Live Births  1     0 2    # Outcome Date GA Lbr Len/2nd Weight Sex Delivery Anes PTL Lv  4 Term 11/22/14 8035w4d  7 lb 9.3 oz (3.44 kg) F Vag-Spont None    3 Term 2012   8 lb 9 oz (3.884 kg) F Vag-Spont   LIV  2 Para 2010    F Vag-Spont   LIV  1 SAB             Obstetric Comments  Menstrual age: [age]    Age 1st Pregnancy:22    Past Medical History:  Diagnosis Date  . Diastasis recti   . Gallbladder polyp 2018   resolved; not seen on RUQ [place] 2018; per brynett, no further imaging unless symptoms  . Pancreatitis   . Umbilical hernia     Past Surgical History:  Procedure Laterality Date  . VAGINAL DELIVERY     3x no complications  . WISDOM TOOTH EXTRACTION      No current outpatient medications on file prior to visit.   No current facility-administered medications on file prior to visit.     Allergies  Allergen Reactions  . Neosporin [Neomycin-Bacitracin Zn-Polymyx] Rash    Social History   Socioeconomic History  . Marital status: Married    Spouse name: Not on file  . Number of children: Not on file  . Years of education: Not on file  . Highest education level: Not on file  Occupational History  . Not on file  Social Needs  . Financial resource strain: Not on file  . Food insecurity:    Worry: Not on file    Inability: Not on file   . Transportation needs:    Medical: Not on file    Non-medical: Not on file  Tobacco Use  . Smoking status: Former Smoker    Years: 5.00    Last attempt to quit: 01/15/2008    Years since quitting: 9.5  . Smokeless tobacco: Never Used  Substance and Sexual Activity  . Alcohol use: Yes    Alcohol/week: 1.2 oz    Types: 2 Standard drinks or equivalent per week  . Drug use: No  . Sexual activity: Yes    Birth control/protection: None    Comment: husband- vasectomy  Lifestyle  . Physical activity:    Days per week: Not on file    Minutes per session: Not on file  . Stress: Not on file  Relationships  . Social connections:    Talks on phone: Not on file    Gets together: Not on file    Attends religious service: Not on file    Active member of club or organization: Not on file    Attends meetings of clubs  or organizations: Not on file    Relationship status: Not on file  . Intimate partner violence:    Fear of current or ex partner: Not on file    Emotionally abused: Not on file    Physically abused: Not on file    Forced sexual activity: Not on file  Other Topics Concern  . Not on file  Social History Narrative   Lives in [place] with husband       3 daughters      Diet - regular      Exercise - 3-4x per week, runs 10-53mile/week    Family History  Problem Relation Age of Onset  . Cancer Paternal Grandmother        kidney    The following portions of the patient's history were reviewed and updated as appropriate: allergies, current medications, past family history, past medical history, past social history, past surgical history and problem list.  Review of Systems Review of Systems - Negative except as mentioned in HPI Review of Systems - General ROS: negative for - chills, fatigue, fever, hot flashes, malaise or night sweats Hematological and Lymphatic ROS: negative for - bleeding problems or swollen lymph nodes Gastrointestinal ROS: negative for - abdominal  pain, blood in stools, change in bowel habits and nausea/vomiting Musculoskeletal ROS: negative for - joint pain, muscle pain or muscular weakness Genito-Urinary ROS: negative for - change in menstrual cycle, dysmenorrhea, dyspareunia, dysuria, genital discharge, genital ulcers, hematuria, incontinence, irregular/heavy menses, nocturia or pelvic pain  Objective:   BP 136/87   Pulse 84   Ht 5\' 2"  (1.575 m)   Wt 123 lb 4 oz (55.9 kg)   LMP 08/13/2017 (Exact Date)   BMI 22.54 kg/m  CONSTITUTIONAL: Well-developed, well-nourished female in no acute distress.  HENT:  Normocephalic, atraumatic.  NECK: Normal range of motion, supple, no masses.  SKIN: Skin is warm and dry. No rash noted. Not diaphoretic. No erythema. No pallor. NEUROLGIC: Alert and oriented to person, place, and time.  PSYCHIATRIC: Normal mood and affect. Normal behavior. Normal judgment and thought content. Anxious  CARDIOVASCULAR:RRR RESPIRATORY: no distress, pink BREASTS: Not Examined ABDOMEN: Soft, non distended; Non tender.  No Organomegaly. PELVIC:  External Genitalia: Normal  BUS: Normal  Vagina: Normal  Cervix: Normal, normal discharge no odor noted,no signs of any remnants of tampon.   MUSCULOSKELETAL: Normal range of motion. No tenderness.  No cyanosis, clubbing, or edema.   Assessment:   Retained tampon   Plan:  Reviewed signs and symptoms of infections. Follow up as needed.   Doreene Burke, CNM

## 2017-08-18 NOTE — Patient Instructions (Signed)
Toxic Shock Syndrome °Toxic shock syndrome (TSS) is a life-threatening condition that can result from infections with certain bacteria. When these bacteria infect the body, they release chemicals (toxins) into the bloodstream that can damage the vital organs. TSS can affect healthy or ill people of any age or gender. It is most common in menstruating women. °TSS can result in: °· Shock. °· Heart failure. °· Liver failure. °· Kidney failure. °· Lung damage. °· Respiratory failure. °· Rash and ulcers. °· Blood clotting and bleeding problems. °TSS is a medical emergency that usually requires hospitalization. °CAUSES °This condition is often caused by one of these types of bacteria: °·  Staphylococcus aureus. °·  Streptococcus pyogenes. °TSS occurs after these bacteria enter the body through breaks, cracks, or wounds in the skin or other body tissue. It often occurs when these bacteria grow on a tampon and infect a woman through the vagina. °RISK FACTORS °This condition is more likely to develop in: °· People who have had an injury that breaks the skin. °· People who have recently had surgery. °· People who have a Staphylococcus aureus skin infection. °· People who have foreign material inside the body, such as packings that are used to stop nosebleeds. °· People who have a viral infection. °· People who have diabetes. °· Women who use high-absorbency tampons. °· Women who leave tampons inserted longer than recommended. °· Women who use diaphragms, sponges, or cervical caps. °· Women who have recently given birth. °· Women who have recently had a procedure that involves the sexual organs. °SYMPTOMS °Symptoms of this condition may include: °· High fever. °· Vomiting. °· Diarrhea. °· Sunburn-like rash. °· Increased heart rate. °· Shock. °· Muscle aches and pains. °· Headaches. °· Sore throat. °· Redness of the mouth, throat, or eyes. °· Confusion. °· Shortness of breath. °· Swelling or peeling of the skin on the palms of  the hands or soles of the feet. °There may or may not be signs of infection at the site where the bacteria entered your body. °DIAGNOSIS °This condition may be diagnosed based on a physical exam and other tests. °Tests for men and women include: °· Blood tests. °· Urine tests. °· Blood, wound, and skin sample cultures. °· Cultures from the mucus (sputum) and throat. °· Imaging studies, such as X-rays, MRI, and CT scans. °· Lumbar puncture to check spinal fluid. °Tests that are done only in women include cultures from the vagina. °TREATMENT  °Hospitalization is usually required, even in mild cases. There is no way to know whether a person with early TSS will develop severe medical problems. °Common treatments for men and women include: °· Antibiotic medicines that are given through an IV tube. °· IV fluids and medicines that are given: °? To raise low blood pressure. °? To replace fluids and body salts (electrolytes) that are lost from vomiting or diarrhea. °· Dialysis, if the kidneys are severely affected. °· Removal of any foreign material in the body, such as nasal packings. °Treatment for women includes immediate removal of any tampon, diaphragm, sponge, or cervical cap, if this applies. °In rare cases, men and women may need treatments including: °· Surgery to remove infected tissue. °· A kidney transplant. °HOME CARE INSTRUCTIONS °· Take over-the-counter and prescription medicines only as told by your health care provider. °· If you were prescribed antibiotic medicine, take it as told by your health care provider. Do not stop taking the antibiotic even if you start to feel better or your condition improves. °· (  Women) Do not use tampons if you have had TSS. °PREVENTION °For men and women: °· Follow instructions from your health care provider about proper wound care to prevent infection. °· Do not use gauze packing for a nosebleed. °· Follow instructions for proper care of tattoos and piercings. °For  women: °· Wash your hands with soap and warm water before you insert or remove a tampon, diaphragm, sponge, or cervical cap. °· Use tampons only as told by your health care provider. Follow guidelines for recommended tampon use. °· If you develop a fever or a rash while using a tampon, remove the tampon immediately. °· Choose tampons that have the lowest absorbency that meets your needs. Avoid using super-absorbent tampons. °· Alternate the use of tampons with sanitary napkins or pads during your period. °· Change tampons every 4-8 hours. °· When inserting a tampon, be very careful not to scratch the vaginal lining. Plastic applicators may increase risk because of the sharp edges. If your vagina seems dry, use a water-soluble lubricating jelly to insert the tampon. °· Do not use tampons between periods. °· Do not use tampons that are made with synthetic materials. Use only 100% cotton tampons. °· Do not leave a tampon inserted overnight if you plan to sleep more than 6-8 hours. Put in a fresh tampon before you sleep and remove it immediately upon waking, or use sanitary napkins or pads at night. °· Do not leave in diaphragms, sponges, or cervical caps longer than 12 hours or as told by your health care provider. °· Do not use tampons, a sponge, a cervical cap, or a diaphragm for 12 weeks after delivering a baby. °SEEK MEDICAL CARE IF: °· You are a woman who has delivered a baby within the past 12 weeks and you are exposed to someone who has a confirmed case of strep throat. °SEEK IMMEDIATE MEDICAL CARE IF: °· You have a fever, chills, vomiting, or diarrhea. °· You have a rash or severe bruising. °· You have severe dizziness or you faint. °· You have a seizure. °· You have redness, pain, or swelling around a wound. °· You have blood or pus coming from a wound. °· You have chest pain or difficulty breathing. °· You have sudden or severe pain in your abdomen. °· You have sudden bleeding from your nose or gums. °· You  have red streaks on your skin that come from an injury, a piercing, or a tattoo. °· Your symptoms return after your condition improves. °This information is not intended to replace advice given to you by your health care provider. Make sure you discuss any questions you have with your health care provider. °Document Released: 05/17/2002 Document Revised: 11/15/2014 Document Reviewed: 05/22/2014 °Elsevier Interactive Patient Education © 2017 [employer] Inc[employer] ° °

## 2017-08-18 NOTE — Progress Notes (Signed)
Pt is here with c/o retained tampon. C/o foul vag odor.

## 2017-08-27 ENCOUNTER — Encounter: Payer: Self-pay | Admitting: Obstetrics and Gynecology

## 2017-09-02 ENCOUNTER — Encounter: Payer: Self-pay | Admitting: Obstetrics and Gynecology

## 2017-10-20 ENCOUNTER — Other Ambulatory Visit: Payer: Self-pay | Admitting: Obstetrics and Gynecology

## 2017-10-20 MED ORDER — TERCONAZOLE 0.4 % VA CREA: 1.0000 | TOPICAL_CREAM | Freq: Every day | VAGINAL | 4 refills | Status: DC

## 2017-12-01 ENCOUNTER — Ambulatory Visit (INDEPENDENT_AMBULATORY_CARE_PROVIDER_SITE_OTHER): Payer: BLUE CROSS/BLUE SHIELD | Admitting: Obstetrics and Gynecology

## 2017-12-01 ENCOUNTER — Encounter: Payer: Self-pay | Admitting: Obstetrics and Gynecology

## 2017-12-01 VITALS — BP 118/78 | HR 98 | Ht 62.0 in | Wt 126.5 lb

## 2017-12-01 DIAGNOSIS — Z01419 Encounter for gynecological examination (general) (routine) without abnormal findings: Secondary | ICD-10-CM | POA: Diagnosis not present

## 2017-12-01 DIAGNOSIS — Z23 Encounter for immunization: Secondary | ICD-10-CM

## 2017-12-01 NOTE — Patient Instructions (Signed)
Preventive Care 18-39 Years, Female Preventive care refers to lifestyle choices and visits with your health care provider that can promote health and wellness. What does preventive care include?  A yearly physical exam. This is also called an annual well check.  Dental exams once or twice a year.  Routine eye exams. Ask your health care provider how often you should have your eyes checked.  Personal lifestyle choices, including: ? Daily care of your teeth and gums. ? Regular physical activity. ? Eating a healthy diet. ? Avoiding tobacco and drug use. ? Limiting alcohol use. ? Practicing safe sex. ? Taking vitamin and mineral supplements as recommended by your health care provider. What happens during an annual well check? The services and screenings done by your health care provider during your annual well check will depend on your age, overall health, lifestyle risk factors, and family history of disease. Counseling Your health care provider may ask you questions about your:  Alcohol use.  Tobacco use.  Drug use.  Emotional well-being.  Home and relationship well-being.  Sexual activity.  Eating habits.  Work and work Statistician.  Method of birth control.  Menstrual cycle.  Pregnancy history.  Screening You may have the following tests or measurements:  Height, weight, and BMI.  Diabetes screening. This is done by checking your blood sugar (glucose) after you have not eaten for a while (fasting).  Blood pressure.  Lipid and cholesterol levels. These may be checked every 5 years starting at age 38.  Skin check.  Hepatitis C blood test.  Hepatitis B blood test.  Sexually transmitted disease (STD) testing.  BRCA-related cancer screening. This may be done if you have a family history of breast, ovarian, tubal, or peritoneal cancers.  Pelvic exam and Pap test. This may be done every 3 years starting at age 38. Starting at age 30, this may be done  every 5 years if you have a Pap test in combination with an HPV test.  Discuss your test results, treatment options, and if necessary, the need for more tests with your health care provider. Vaccines Your health care provider may recommend certain vaccines, such as:  Influenza vaccine. This is recommended every year.  Tetanus, diphtheria, and acellular pertussis (Tdap, Td) vaccine. You may need a Td booster every 10 years.  Varicella vaccine. You may need this if you have not been vaccinated.  HPV vaccine. If you are 39 or younger, you may need three doses over 6 months.  Measles, mumps, and rubella (MMR) vaccine. You may need at least one dose of MMR. You may also need a second dose.  Pneumococcal 13-valent conjugate (PCV13) vaccine. You may need this if you have certain conditions and were not previously vaccinated.  Pneumococcal polysaccharide (PPSV23) vaccine. You may need one or two doses if you smoke cigarettes or if you have certain conditions.  Meningococcal vaccine. One dose is recommended if you are age 68-21 years and a first-year college student living in a residence hall, or if you have one of several medical conditions. You may also need additional booster doses.  Hepatitis A vaccine. You may need this if you have certain conditions or if you travel or work in places where you may be exposed to hepatitis A.  Hepatitis B vaccine. You may need this if you have certain conditions or if you travel or work in places where you may be exposed to hepatitis B.  Haemophilus influenzae type b (Hib) vaccine. You may need this  if you have certain risk factors.  Talk to your health care provider about which screenings and vaccines you need and how often you need them. This information is not intended to replace advice given to you by your health care provider. Make sure you discuss any questions you have with your health care provider. Document Released: 04/22/2001 Document Revised:  11/14/2015 Document Reviewed: 12/26/2014 Elsevier Interactive Patient Education  2018 [employer] Inc[employer]  

## 2017-12-01 NOTE — Progress Notes (Signed)
Subjective:   Renee Fleming is a 32 y.o. [phone] Caucasian female here for a routine well-woman exam.  Patient's last menstrual period was 11/28/2017.    Current complaints: none, still needs hernia repaired. PCP: sonnenburge       doesn't desire labs  Social History: Sexual: heterosexual Marital Status: married Living situation: with family Occupation: FT at [employer] Tobacco/alcohol: no tobacco use Illicit drugs: no history of illicit drug use  The following portions of the patient's history were reviewed and updated as appropriate: allergies, current medications, past family history, past medical history, past social history, past surgical history and problem list.  Past Medical History Past Medical History:  Diagnosis Date  . Diastasis recti   . Gallbladder polyp 2018   resolved; not seen on RUQ US 2018; per brynett, no further imaging unless symptoms  . Pancreatitis   . Umbilical hernia     Past Surgical History Past Surgical History:  Procedure Laterality Date  . VAGINAL DELIVERY     3x no complications  . WISDOM TOOTH EXTRACTION      Gynecologic History [id number]  Patient's last menstrual period was 11/28/2017. Contraception: vasectomy Last Pap: 2017. Results were: normal   Obstetric History OB History  Gravida Para Term Preterm AB Living  4 3 2   1 2   SAB TAB Ectopic Multiple Live Births  1     0 2    # Outcome Date GA Lbr Len/2nd Weight Sex Delivery Anes PTL Lv  4 Term 11/22/14 [id number]  7 lb 9.3 oz (3.44 kg) F Vag-Spont None    3 Term 2012   8 lb 9 oz (3.884 kg) F Vag-Spont   LIV  2 Para 2010    F Vag-Spont   LIV  1 SAB             Obstetric Comments  Menstrual age: 89    Age 1st Pregnancy:22    Current Medications Current Outpatient Medications on File Prior to Visit  Medication Sig Dispense Refill  . terconazole (TERAZOL 7) 0.4 % vaginal cream Place 1 applicator vaginally at bedtime. (Patient not taking: Reported on 12/01/2017) 45 g 4   No  current facility-administered medications on file prior to visit.     Review of Systems Patient denies any headaches, blurred vision, shortness of breath, chest pain, abdominal pain, problems with bowel movements, urination, or intercourse.  Objective:  BP 118/78   Pulse 98   Ht 5\' 2"  (1.575 m)   Wt 126 lb 8 oz (57.4 kg)   LMP 11/28/2017   BMI 23.14 kg/m  Physical Exam  General:  Well developed, well nourished, no acute distress. She is alert and oriented x3. Skin:  Warm and dry Neck:  Midline trachea, no thyromegaly or nodules Cardiovascular: Regular rate and rhythm, no murmur heard Lungs:  Effort normal, all lung fields clear to auscultation bilaterally Breasts:  No dominant palpable mass, retraction, or nipple discharge Abdomen:  Soft, non tender, no hepatosplenomegaly or masses Pelvic:  External genitalia is normal in appearance.  The vagina is normal in appearance. The cervix is bulbous, no CMT.  Thin prep pap is not done. Uterus is felt to be normal size, shape, and contour.  No adnexal masses or tenderness noted. Extremities:  No swelling or varicosities noted Psych:  She has a normal mood and affect  Assessment:   Healthy well-woman exam Needs flu vaccine  Plan:  Labs obtained will follow up accordingly Flu vaccine given F/U 1 year for  AE, or sooner if needed   Melody Rockney Ghee, CNM

## 2017-12-02 LAB — COMPREHENSIVE METABOLIC PANEL
ALBUMIN: 5.2 g/dL (ref 3.5–5.5)
ALT: 12 IU/L (ref 0–32)
AST: 18 IU/L (ref 0–40)
Albumin/Globulin Ratio: 2.3 — ABNORMAL HIGH (ref 1.2–2.2)
Alkaline Phosphatase: 65 IU/L (ref 39–117)
BUN / CREAT RATIO: 19 (ref 9–23)
BUN: 15 mg/dL (ref 6–20)
Bilirubin Total: 0.3 mg/dL (ref 0.0–1.2)
CHLORIDE: 99 mmol/L (ref 96–106)
CO2: 23 mmol/L (ref 20–29)
CREATININE: 0.79 mg/dL (ref 0.57–1.00)
Calcium: 9.9 mg/dL (ref 8.7–10.2)
GFR calc non Af Amer: 98 mL/min/{1.73_m2} (ref >59)
GFR, EST AFRICAN AMERICAN: 113 mL/min/{1.73_m2} (ref >59)
GLUCOSE: 93 mg/dL (ref 65–99)
Globulin, Total: 2.3 g/dL (ref 1.5–4.5)
Potassium: 4.2 mmol/L (ref 3.5–5.2)
Sodium: 139 mmol/L (ref 134–144)
TOTAL PROTEIN: 7.5 g/dL (ref 6.0–8.5)

## 2017-12-02 LAB — LIPID PANEL
CHOL/HDL RATIO: 2.6 ratio (ref 0.0–4.4)
Cholesterol, Total: 213 mg/dL — ABNORMAL HIGH (ref 100–199)
HDL: 81 mg/dL (ref >39)
LDL Calculated: 93 mg/dL (ref 0–99)
Triglycerides: 196 mg/dL — ABNORMAL HIGH (ref 0–149)
VLDL Cholesterol Cal: 39 mg/dL (ref 5–40)

## 2017-12-22 ENCOUNTER — Ambulatory Visit: Payer: BLUE CROSS/BLUE SHIELD | Admitting: Family Medicine

## 2017-12-22 DIAGNOSIS — Z0289 Encounter for other administrative examinations: Secondary | ICD-10-CM

## 2017-12-22 DIAGNOSIS — L700 Acne vulgaris: Secondary | ICD-10-CM | POA: Diagnosis not present

## 2017-12-22 DIAGNOSIS — L02426 Furuncle of left lower limb: Secondary | ICD-10-CM | POA: Diagnosis not present

## 2017-12-22 NOTE — Progress Notes (Deleted)
   Subjective:    Patient ID: Renee Fleming, female    DOB: Sep 25, 1985, 32 y.o.   MRN: [id number]  HPI   Presents to clinic c/o 2 areas on left leg that are red and swollen. They have been there for....   Patient Active Problem List   Diagnosis Date Noted  . History of pancreatitis 07/30/2015  . Routine physical examination 02/09/2014  . Adjustment disorder with mixed anxiety and depressed mood 02/09/2014  . Ventral hernia 01/14/2013   Social History   Tobacco Use  . Smoking status: Former Smoker    Years: 5.00    Last attempt to quit: 01/15/2008    Years since quitting: 9.9  . Smokeless tobacco: Never Used  Substance Use Topics  . Alcohol use: Yes    Alcohol/week: 2.0 standard drinks    Types: 2 Standard drinks or equivalent per week   Review of Systems  Constitutional: Negative for chills, fatigue and fever.  HENT: Negative for congestion, ear pain, sinus pain and sore throat.   Eyes: Negative.   Respiratory: Negative for cough, shortness of breath and wheezing.   Cardiovascular: Negative for chest pain, palpitations and leg swelling.  Gastrointestinal: Negative for abdominal pain, diarrhea, nausea and vomiting.  Genitourinary: Negative for dysuria, frequency and urgency.  Musculoskeletal: Negative for arthralgias and myalgias.  Skin: +red spots on left leg Neurological: Negative for syncope, light-headedness and headaches.  Psychiatric/Behavioral: The patient is not nervous/anxious.       Objective:   Physical Exam        Assessment & Plan:

## 2018-04-21 ENCOUNTER — Encounter: Payer: Self-pay | Admitting: *Deleted

## 2018-12-03 ENCOUNTER — Encounter: Payer: BLUE CROSS/BLUE SHIELD | Admitting: Obstetrics and Gynecology

## 2018-12-24 IMAGING — US US ABDOMEN LIMITED
1 series · 14 of 25 positions shown · non-contrast
Comparison: 07/12/2015

CLINICAL DATA: Gallbladder follow-up, history pain tightness

EXAM:
ULTRASOUND ABDOMEN LIMITED RIGHT UPPER QUADRANT

[Series 1: us abdomen limited · 0.19mm/px · 49 acquisitions, 14 frames shown]
[im 1/49]
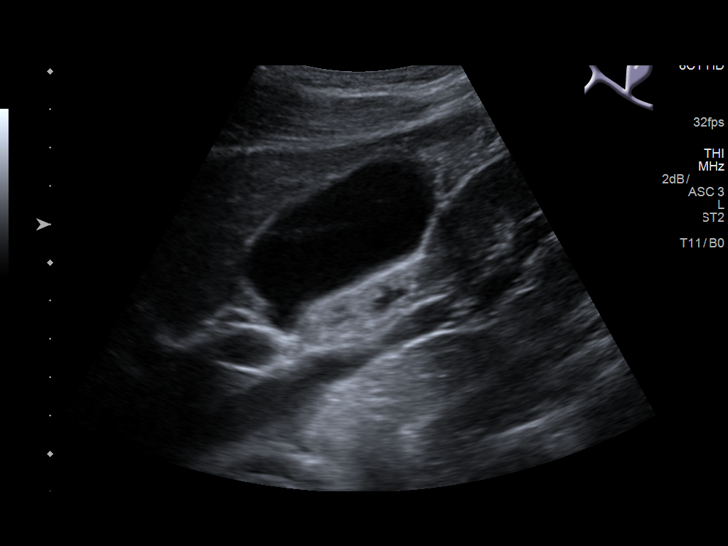
[im 5/49]
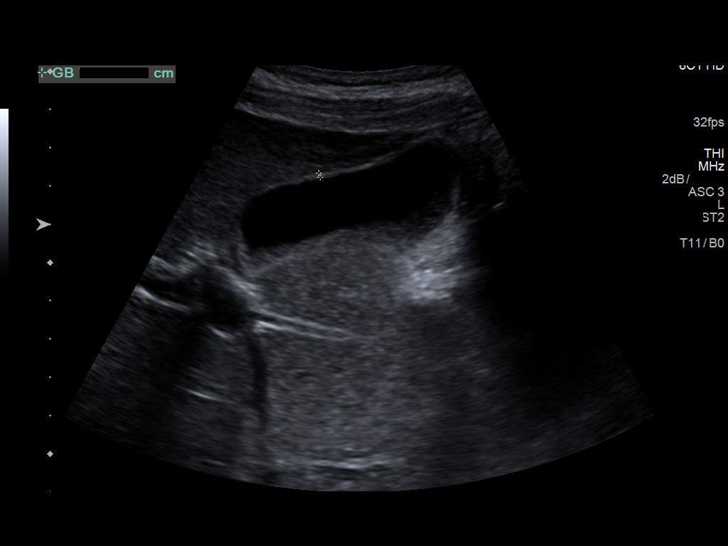
[im 9/49]
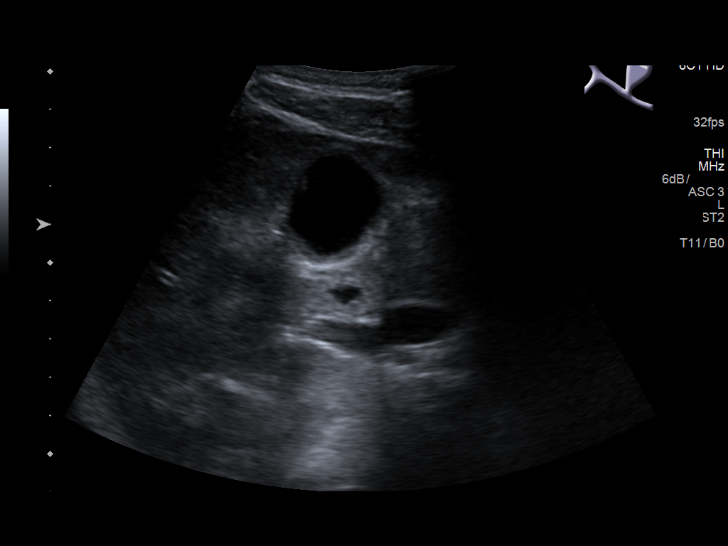
[im 13/49]
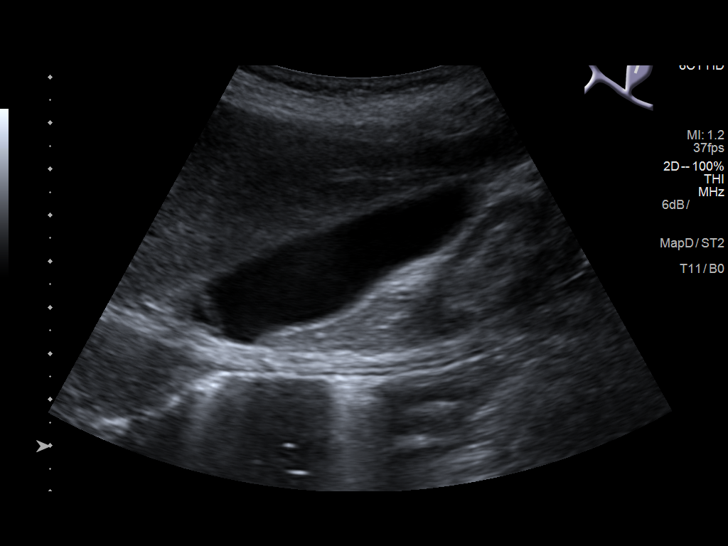
[im 17/49]
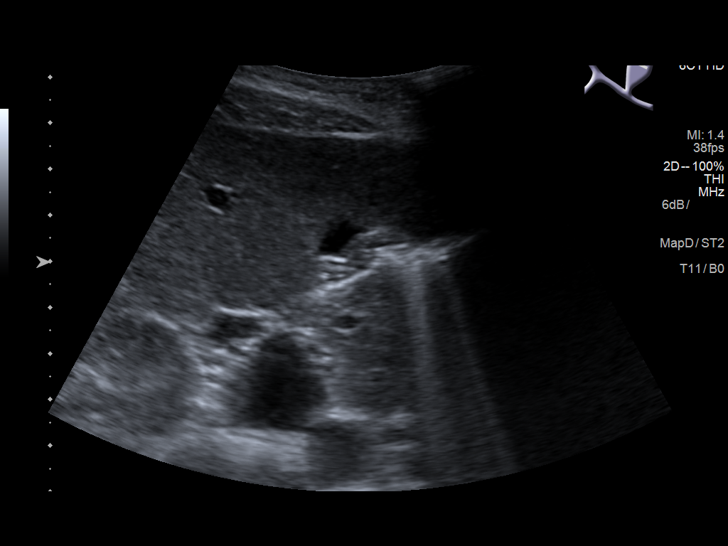
[im 19/49]
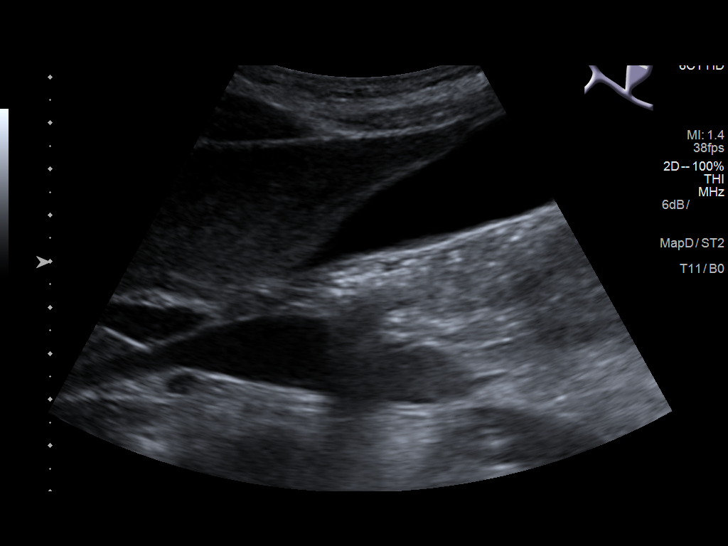
[im 23/49]
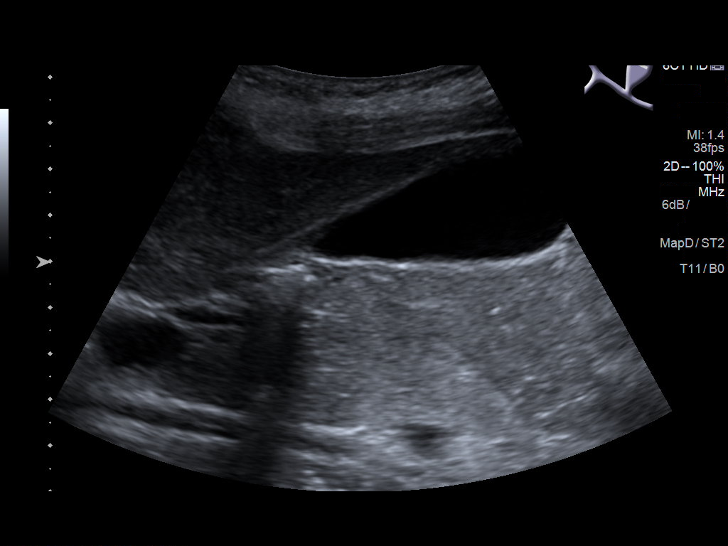
[im 27/49]
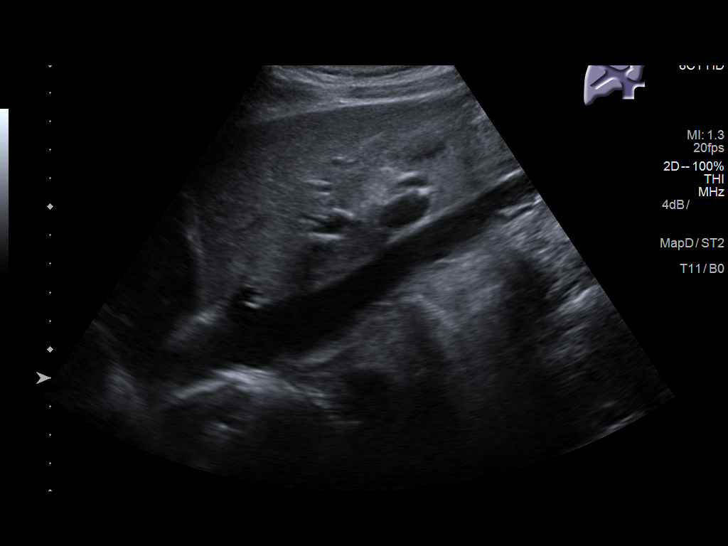
[im 31/49]
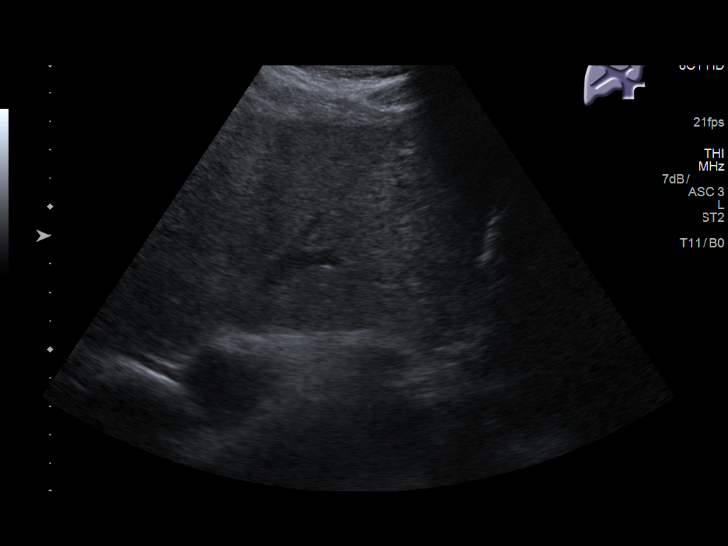
[im 33/49]
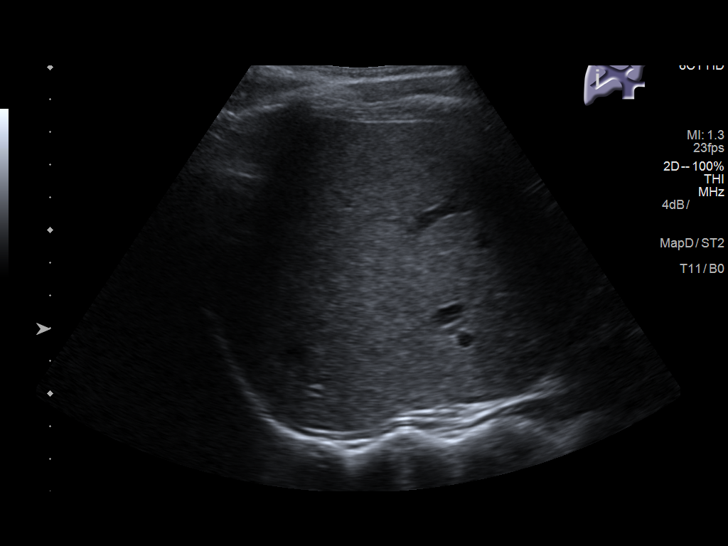
[im 37/49]
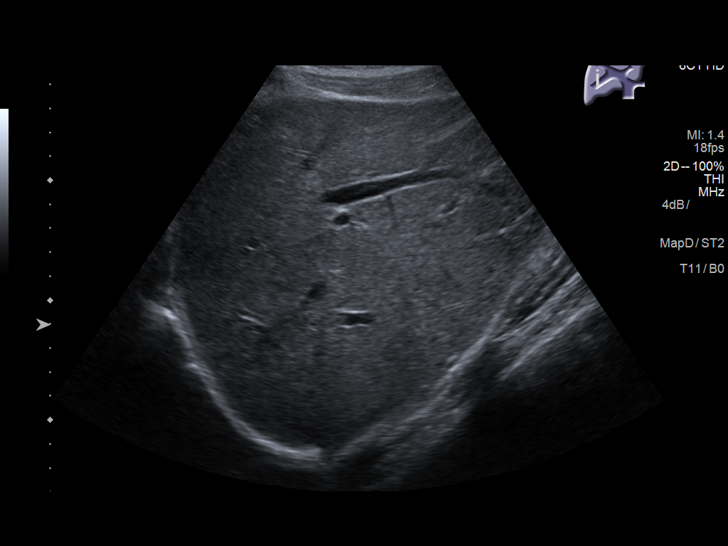
[im 41/49]
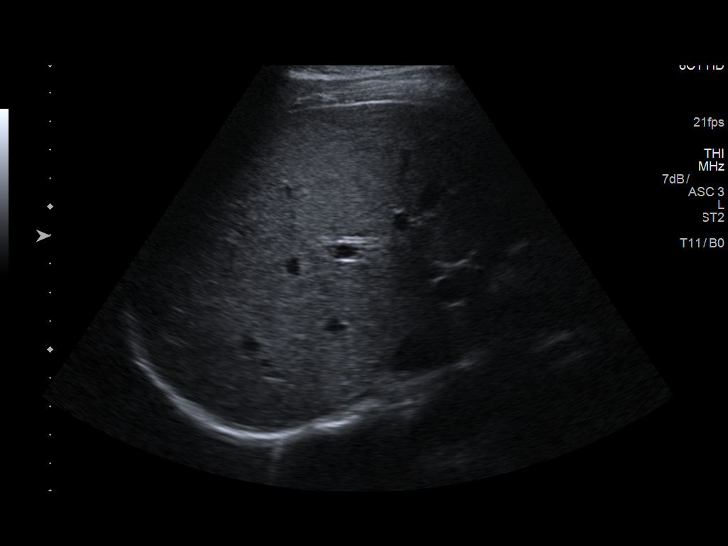
[im 45/49]
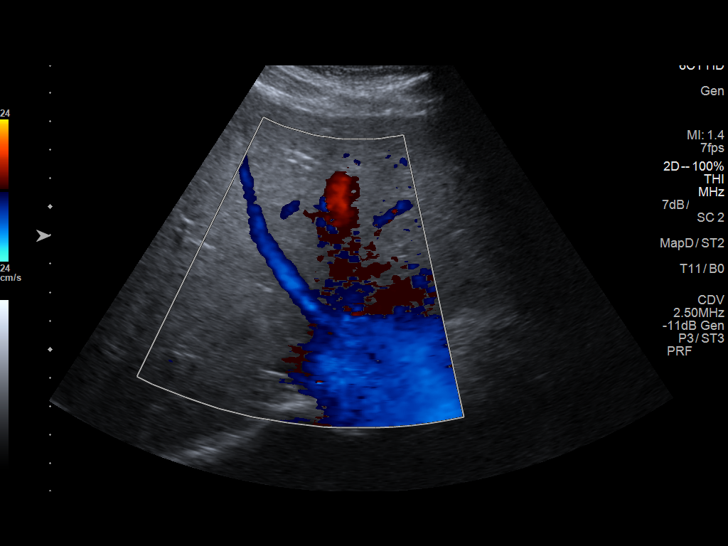
[im 49/49]
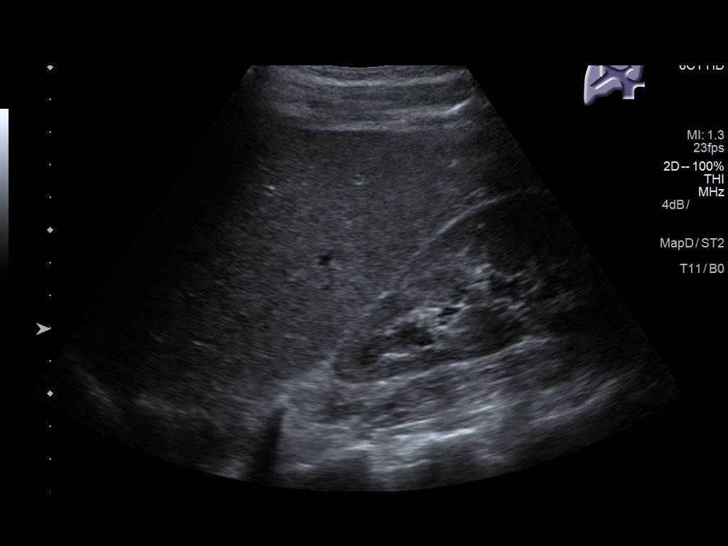

[14 of 25 positions shown; findings below may reference images not displayed]

FINDINGS: Gallbladder:

No gallstones or wall thickening visualized. No sonographic Murphy
sign noted by sonographer. No gallbladder polyp is identified on the
current exam. Gallbladder fold is identified in the supine position
which effaces in the upright position.

Common bile duct:

Diameter: 2.3 mm

Liver:

No focal lesion identified. Within normal limits in parenchymal
echogenicity. Portal vein is patent on color Doppler imaging with
normal direction of blood flow towards the liver.
IMPRESSION: 1. No cholelithiasis or sonographic evidence of acute cholecystitis.
2. No gallbladder polyp is identified on the current exam.
Gallbladder fold is identified in the supine position which effaces
in the upright position.

## 2019-01-18 DIAGNOSIS — Z20828 Contact with and (suspected) exposure to other viral communicable diseases: Secondary | ICD-10-CM | POA: Diagnosis not present

## 2019-01-22 DIAGNOSIS — Z20828 Contact with and (suspected) exposure to other viral communicable diseases: Secondary | ICD-10-CM | POA: Diagnosis not present

## 2019-02-10 ENCOUNTER — Encounter: Payer: BC Managed Care – PPO | Admitting: Obstetrics and Gynecology

## 2019-03-23 ENCOUNTER — Encounter: Payer: BLUE CROSS/BLUE SHIELD | Admitting: Family

## 2019-03-30 ENCOUNTER — Telehealth: Payer: BLUE CROSS/BLUE SHIELD | Admitting: Family

## 2019-03-30 DIAGNOSIS — R399 Unspecified symptoms and signs involving the genitourinary system: Secondary | ICD-10-CM

## 2019-03-30 MED ORDER — CEPHALEXIN 500 MG PO CAPS: 500.0000 mg | ORAL_CAPSULE | Freq: Two times a day (BID) | ORAL | 0 refills | Status: DC

## 2019-03-30 NOTE — Progress Notes (Signed)

## 2019-05-23 ENCOUNTER — Encounter: Payer: BLUE CROSS/BLUE SHIELD | Admitting: Family

## 2019-06-14 ENCOUNTER — Ambulatory Visit (INDEPENDENT_AMBULATORY_CARE_PROVIDER_SITE_OTHER): Payer: BC Managed Care – PPO | Admitting: Family

## 2019-06-14 ENCOUNTER — Encounter: Payer: Self-pay | Admitting: Family

## 2019-06-14 ENCOUNTER — Other Ambulatory Visit: Payer: Self-pay

## 2019-06-14 VITALS — BP 118/88 | HR 109 | Temp 97.0°F | Ht 63.0 in | Wt 144.8 lb

## 2019-06-14 DIAGNOSIS — Z0000 Encounter for general adult medical examination without abnormal findings: Secondary | ICD-10-CM | POA: Diagnosis not present

## 2019-06-14 DIAGNOSIS — R002 Palpitations: Secondary | ICD-10-CM

## 2019-06-14 DIAGNOSIS — F1010 Alcohol abuse, uncomplicated: Secondary | ICD-10-CM | POA: Diagnosis not present

## 2019-06-14 DIAGNOSIS — F4323 Adjustment disorder with mixed anxiety and depressed mood: Secondary | ICD-10-CM | POA: Diagnosis not present

## 2019-06-14 LAB — COMPREHENSIVE METABOLIC PANEL
ALT: 96 U/L — ABNORMAL HIGH (ref 0–35)
AST: 203 U/L — ABNORMAL HIGH (ref 0–37)
Albumin: 5.2 g/dL (ref 3.5–5.2)
Alkaline Phosphatase: 66 U/L (ref 39–117)
BUN: 7 mg/dL (ref 6–23)
CO2: 18 mEq/L — ABNORMAL LOW (ref 19–32)
Calcium: 9 mg/dL (ref 8.4–10.5)
Chloride: 97 mEq/L (ref 96–112)
Creatinine, Ser: 0.72 mg/dL (ref 0.40–1.20)
GFR: 91.84 mL/min (ref >60.00)
Glucose, Bld: 93 mg/dL (ref 70–99)
Potassium: 3.4 mEq/L — ABNORMAL LOW (ref 3.5–5.1)
Sodium: 140 mEq/L (ref 135–145)
Total Bilirubin: 0.4 mg/dL (ref 0.2–1.2)
Total Protein: 8.1 g/dL (ref 6.0–8.3)

## 2019-06-14 LAB — CBC WITH DIFFERENTIAL/PLATELET
Basophils Absolute: 0 10*3/uL (ref 0.0–0.1)
Basophils Relative: 0.6 % (ref 0.0–3.0)
Eosinophils Absolute: 0.1 10*3/uL (ref 0.0–0.7)
Eosinophils Relative: 2.7 % (ref 0.0–5.0)
HCT: 39.6 % (ref 36.0–46.0)
Hemoglobin: 13.7 g/dL (ref 12.0–15.0)
Lymphocytes Relative: 48.6 % — ABNORMAL HIGH (ref 12.0–46.0)
Lymphs Abs: 1.9 10*3/uL (ref 0.7–4.0)
MCHC: 34.6 g/dL (ref 30.0–36.0)
MCV: 95.3 fl (ref 78.0–100.0)
Monocytes Absolute: 0.5 10*3/uL (ref 0.1–1.0)
Monocytes Relative: 11.9 % (ref 3.0–12.0)
Neutro Abs: 1.4 10*3/uL (ref 1.4–7.7)
Neutrophils Relative %: 36.2 % — ABNORMAL LOW (ref 43.0–77.0)
Platelets: 152 10*3/uL (ref 150.0–400.0)
RBC: 4.16 Mil/uL (ref 3.87–5.11)
RDW: 13 % (ref 11.5–15.5)
WBC: 3.9 10*3/uL — ABNORMAL LOW (ref 4.0–10.5)

## 2019-06-14 LAB — MAGNESIUM: Magnesium: 1.8 mg/dL (ref 1.5–2.5)

## 2019-06-14 LAB — TSH: TSH: 2.4 u[IU]/mL (ref 0.35–4.50)

## 2019-06-14 MED ORDER — SERTRALINE HCL 25 MG PO TABS: 25.0000 mg | ORAL_TABLET | Freq: Every day | ORAL | 1 refills | Status: AC

## 2019-06-14 NOTE — Patient Instructions (Addendum)
Call [facility] about labs and screening/baseline mammogram and let me know if I can order .  Start zoloft   Please call the office if you are drinking more alcohol and feel that you need more help; we have referral to counselor to discuss anxiety and alcohol use. Please call us if you dont hear from [place] in this in regard in ONE week.   Close follow up Sertraline tablets What is this medicine? SERTRALINE (SER tra leen) is used to treat depression. It may also be used to treat obsessive compulsive disorder, panic disorder, post-trauma stress, premenstrual dysphoric disorder (PMDD) or social anxiety. This medicine may be used for other purposes; ask your health care provider or pharmacist if you have questions. COMMON BRAND NAME(S): Zoloft What should I tell my health care provider before I take this medicine? They need to know if you have any of these conditions:  bleeding disorders  bipolar disorder or a family history of bipolar disorder  glaucoma  heart disease  high blood pressure  history of irregular heartbeat  history of low levels of calcium, magnesium, or potassium in the blood  if you often drink alcohol  liver disease  receiving electroconvulsive therapy  seizures  suicidal thoughts, plans, or attempt; a previous suicide attempt by you or a family member  take medicines that treat or prevent blood clots  thyroid disease  an unusual or allergic reaction to sertraline, other medicines, foods, dyes, or preservatives  pregnant or trying to get pregnant  breast-feeding How should I use this medicine? Take this medicine by mouth with a glass of water. Follow the directions on the prescription label. You can take it with or without food. Take your medicine at regular intervals. Do not take your medicine more often than directed. Do not stop taking this medicine suddenly except upon the advice of your doctor. Stopping this medicine too quickly may cause serious side  effects or your condition may worsen. A special MedGuide will be given to you by the pharmacist with each prescription and refill. Be sure to read this information carefully each time. Talk to your pediatrician regarding the use of this medicine in children. While this drug may be prescribed for children as young as 7 years for selected conditions, precautions do apply. Overdosage: If you think you have taken too much of this medicine contact a poison control center or emergency room at once. NOTE: This medicine is only for you. Do not share this medicine with others. What if I miss a dose? If you miss a dose, take it as soon as you can. If it is almost time for your next dose, take only that dose. Do not take double or extra doses. What may interact with this medicine? Do not take this medicine with any of the following medications:  cisapride  dronedarone  linezolid  MAOIs like Carbex, Eldepryl, Marplan, Nardil, and Parnate  methylene blue (injected into a vein)  pimozide  thioridazine This medicine may also interact with the following medications:  alcohol  amphetamines  aspirin and aspirin-like medicines  certain medicines for depression, anxiety, or psychotic disturbances  certain medicines for fungal infections like ketoconazole, fluconazole, posaconazole, and itraconazole  certain medicines for irregular heart beat like flecainide, quinidine, propafenone  certain medicines for migraine headaches like almotriptan, eletriptan, frovatriptan, naratriptan, rizatriptan, sumatriptan, zolmitriptan  certain medicines for sleep  certain medicines for seizures like carbamazepine, valproic acid, phenytoin  certain medicines that treat or prevent blood clots like warfarin, enoxaparin,  dalteparin  cimetidine  digoxin  diuretics  fentanyl  isoniazid  lithium  NSAIDs, medicines for pain and inflammation, like ibuprofen or naproxen  other medicines that prolong the QT  interval (cause an abnormal heart rhythm) like dofetilide  rasagiline  safinamide  supplements like St. John's wort, kava kava, valerian  tolbutamide  tramadol  tryptophan This list may not describe all possible interactions. Give your health care provider a list of all the medicines, herbs, non-prescription drugs, or dietary supplements you use. Also tell them if you smoke, drink alcohol, or use illegal drugs. Some items may interact with your medicine. What should I watch for while using this medicine? Tell your doctor if your symptoms do not get better or if they get worse. Visit your doctor or health care professional for regular checks on your progress. Because it may take several weeks to see the full effects of this medicine, it is important to continue your treatment as prescribed by your doctor. Patients and their families should watch out for new or worsening thoughts of suicide or depression. Also watch out for sudden changes in feelings such as feeling anxious, agitated, panicky, irritable, hostile, aggressive, impulsive, severely restless, overly excited and hyperactive, or not being able to sleep. If this happens, especially at the beginning of treatment or after a change in dose, call your health care professional. Bonita Quin may get drowsy or dizzy. Do not drive, use machinery, or do anything that needs mental alertness until you know how this medicine affects you. Do not stand or sit up quickly, especially if you are an older patient. This reduces the risk of dizzy or fainting spells. Alcohol may interfere with the effect of this medicine. Avoid alcoholic drinks. Your mouth may get dry. Chewing sugarless gum or sucking hard candy, and drinking plenty of water may help. Contact your doctor if the problem does not go away or is severe. What side effects may I notice from receiving this medicine? Side effects that you should report to your doctor or health care professional as soon as  possible:  allergic reactions like skin rash, itching or hives, swelling of the face, lips, or tongue  anxious  black, tarry stools  changes in vision  confusion  elevated mood, decreased need for sleep, racing thoughts, impulsive behavior  eye pain  fast, irregular heartbeat  feeling faint or lightheaded, falls  feeling agitated, angry, or irritable  hallucination, loss of contact with reality  loss of balance or coordination  loss of memory  painful or prolonged erections  restlessness, pacing, inability to keep still  seizures  stiff muscles  suicidal thoughts or other mood changes  trouble sleeping  unusual bleeding or bruising  unusually weak or tired  vomiting Side effects that usually do not require medical attention (report to your doctor or health care professional if they continue or are bothersome):  change in appetite or weight  change in sex drive or performance  diarrhea  increased sweating  indigestion, nausea  tremors This list may not describe all possible side effects. Call your doctor for medical advice about side effects. You may report side effects to FDA at 1-800-FDA-1088. Where should I keep my medicine? Keep out of the reach of children. Store at room temperature between 15 and 30 degrees C (59 and 86 degrees F). Throw away any unused medicine after the expiration date. NOTE: This sheet is a summary. It may not cover all possible information. If you have questions about this medicine, talk  to your doctor, pharmacist, or health care provider.  2020 Elsevier/Gold Standard (2018-02-16 10:09:27)   Health Maintenance, Female Adopting a healthy lifestyle and getting preventive care are important in promoting health and wellness. Ask your health care provider about:  The right schedule for you to have regular tests and exams.  Things you can do on your own to prevent diseases and keep yourself healthy. What should I know about  diet, weight, and exercise? Eat a healthy diet   Eat a diet that includes plenty of vegetables, fruits, low-fat dairy products, and lean protein.  Do not eat a lot of foods that are high in solid fats, added sugars, or sodium. Maintain a healthy weight Body mass index (BMI) is used to identify weight problems. It estimates body fat based on height and weight. Your health care provider can help determine your BMI and help you achieve or maintain a healthy weight. Get regular exercise Get regular exercise. This is one of the most important things you can do for your health. Most adults should:  Exercise for at least 150 minutes each week. The exercise should increase your heart rate and make you sweat (moderate-intensity exercise).  Do strengthening exercises at least twice a week. This is in addition to the moderate-intensity exercise.  Spend less time sitting. Even light physical activity can be beneficial. Watch cholesterol and blood lipids Have your blood tested for lipids and cholesterol at 36 years of age, then have this test every 5 years. Have your cholesterol levels checked more often if:  Your lipid or cholesterol levels are high.  You are older than 36 years of age.  You are at high risk for heart disease. What should I know about cancer screening? Depending on your health history and family history, you may need to have cancer screening at various ages. This may include screening for:  Breast cancer.  Cervical cancer.  Colorectal cancer.  Skin cancer.  Lung cancer. What should I know about heart disease, diabetes, and high blood pressure? Blood pressure and heart disease  High blood pressure causes heart disease and increases the risk of stroke. This is more likely to develop in people who have high blood pressure readings, are of African descent, or are overweight.  Have your blood pressure checked: ? Every 3-5 years if you are 69-72 years of age. ? Every year  if you are 22 years old or older. Diabetes Have regular diabetes screenings. This checks your fasting blood sugar level. Have the screening done:  Once every three years after age 2 if you are at a normal weight and have a low risk for diabetes.  More often and at a younger age if you are overweight or have a high risk for diabetes. What should I know about preventing infection? Hepatitis B If you have a higher risk for hepatitis B, you should be screened for this virus. Talk with your health care provider to find out if you are at risk for hepatitis B infection. Hepatitis C Testing is recommended for:  Everyone born from [birth year] through 1967.  Anyone with known risk factors for hepatitis C. Sexually transmitted infections (STIs)  Get screened for STIs, including gonorrhea and chlamydia, if: ? You are sexually active and are younger than 36 years of age. ? You are older than 36 years of age and your health care provider tells you that you are at risk for this type of infection. ? Your sexual activity has changed since you were last  screened, and you are at increased risk for chlamydia or gonorrhea. Ask your health care provider if you are at risk.  Ask your health care provider about whether you are at high risk for HIV. Your health care provider may recommend a prescription medicine to help prevent HIV infection. If you choose to take medicine to prevent HIV, you should first get tested for HIV. You should then be tested every 3 months for as long as you are taking the medicine. Pregnancy  If you are about to stop having your period (premenopausal) and you may become pregnant, seek counseling before you get pregnant.  Take 400 to 800 micrograms (mcg) of folic acid every day if you become pregnant.  Ask for birth control (contraception) if you want to prevent pregnancy. Osteoporosis and menopause Osteoporosis is a disease in which the bones lose minerals and strength with aging. This  can result in bone fractures. If you are 31 years old or older, or if you are at risk for osteoporosis and fractures, ask your health care provider if you should:  Be screened for bone loss.  Take a calcium or vitamin D supplement to lower your risk of fractures.  Be given hormone replacement therapy (HRT) to treat symptoms of menopause. Follow these instructions at home: Lifestyle  Do not use any products that contain nicotine or tobacco, such as cigarettes, e-cigarettes, and chewing tobacco. If you need help quitting, ask your health care provider.  Do not use street drugs.  Do not share needles.  Ask your health care provider for help if you need support or information about quitting drugs. Alcohol use  Do not drink alcohol if: ? Your health care provider tells you not to drink. ? You are pregnant, may be pregnant, or are planning to become pregnant.  If you drink alcohol: ? Limit how much you use to 0-1 drink a day. ? Limit intake if you are breastfeeding.  Be aware of how much alcohol is in your drink. In the [place] one drink equals one 12 oz bottle of beer (355 mL), one 5 oz glass of wine (148 mL), or one 1 oz glass of hard liquor (44 mL). General instructions  Schedule regular health, dental, and eye exams.  Stay current with your vaccines.  Tell your health care provider if: ? You often feel depressed. ? You have ever been abused or do not feel safe at home. Summary  Adopting a healthy lifestyle and getting preventive care are important in promoting health and wellness.  Follow your health care provider's instructions about healthy diet, exercising, and getting tested or screened for diseases.  Follow your health care provider's instructions on monitoring your cholesterol and blood pressure. This information is not intended to replace advice given to you by your health care provider. Make sure you discuss any questions you have with your health care  provider. Document Revised: 02/17/2018 Document Reviewed: 02/17/2018 Elsevier Patient Education  2020 Elsevier Inc.  Generalized Anxiety Disorder, Adult Generalized anxiety disorder (GAD) is a mental health disorder. People with this condition constantly worry about everyday events. Unlike normal anxiety, worry related to GAD is not triggered by a specific event. These worries also do not fade or get better with time. GAD interferes with life functions, including relationships, work, and school. GAD can vary from mild to severe. People with severe GAD can have intense waves of anxiety with physical symptoms (panic attacks). What are the causes? The exact cause of GAD is not known.  What increases the risk? This condition is more likely to develop in:  Women.  People who have a family history of anxiety disorders.  People who are very shy.  People who experience very stressful life events, such as the death of a loved one.  People who have a very stressful family environment. What are the signs or symptoms? People with GAD often worry excessively about many things in their lives, such as their health and family. They may also be overly concerned about:  Doing well at work.  Being on time.  Natural disasters.  Friendships. Physical symptoms of GAD include:  Fatigue.  Muscle tension or having muscle twitches.  Trembling or feeling shaky.  Being easily startled.  Feeling like your heart is pounding or racing.  Feeling out of breath or like you cannot take a deep breath.  Having trouble falling asleep or staying asleep.  Sweating.  Nausea, diarrhea, or irritable bowel syndrome (IBS).  Headaches.  Trouble concentrating or remembering facts.  Restlessness.  Irritability. How is this diagnosed? Your health care provider can diagnose GAD based on your symptoms and medical history. You will also have a physical exam. The health care provider will ask specific  questions about your symptoms, including how severe they are, when they started, and if they come and go. Your health care provider may ask you about your use of alcohol or drugs, including prescription medicines. Your health care provider may refer you to a mental health specialist for further evaluation. Your health care provider will do a thorough examination and may perform additional tests to rule out other possible causes of your symptoms. To be diagnosed with GAD, a person must have anxiety that:  Is out of his or her control.  Affects several different aspects of his or her life, such as work and relationships.  Causes distress that makes him or her unable to take part in normal activities.  Includes at least three physical symptoms of GAD, such as restlessness, fatigue, trouble concentrating, irritability, muscle tension, or sleep problems. Before your health care provider can confirm a diagnosis of GAD, these symptoms must be present more days than they are not, and they must last for six months or longer. How is this treated? The following therapies are usually used to treat GAD:  Medicine. Antidepressant medicine is usually prescribed for long-term daily control. Antianxiety medicines may be added in severe cases, especially when panic attacks occur.  Talk therapy (psychotherapy). Certain types of talk therapy can be helpful in treating GAD by providing support, education, and guidance. Options include: ? Cognitive behavioral therapy (CBT). People learn coping skills and techniques to ease their anxiety. They learn to identify unrealistic or negative thoughts and behaviors and to replace them with positive ones. ? Acceptance and commitment therapy (ACT). This treatment teaches people how to be mindful as a way to cope with unwanted thoughts and feelings. ? Biofeedback. This process trains you to manage your body's response (physiological response) through breathing techniques and  relaxation methods. You will work with a therapist while machines are used to monitor your physical symptoms.  Stress management techniques. These include yoga, meditation, and exercise. A mental health specialist can help determine which treatment is best for you. Some people see improvement with one type of therapy. However, other people require a combination of therapies. Follow these instructions at home:  Take over-the-counter and prescription medicines only as told by your health care provider.  Try to maintain a normal routine.  Try to anticipate stressful situations and allow extra time to manage them.  Practice any stress management or self-calming techniques as taught by your health care provider.  Do not punish yourself for setbacks or for not making progress.  Try to recognize your accomplishments, even if they are small.  Keep all follow-up visits as told by your health care provider. This is important. Contact a health care provider if:  Your symptoms do not get better.  Your symptoms get worse.  You have signs of depression, such as: ? A persistently sad, cranky, or irritable mood. ? Loss of enjoyment in activities that used to bring you joy. ? Change in weight or eating. ? Changes in sleeping habits. ? Avoiding friends or family members. ? Loss of energy for normal tasks. ? Feelings of guilt or worthlessness. Get help right away if:  You have serious thoughts about hurting yourself or others. If you ever feel like you may hurt yourself or others, or have thoughts about taking your own life, get help right away. You can go to your nearest emergency department or call:  Your local emergency services (911 in the [place]  A suicide crisis helpline, such as the National Suicide Prevention Lifeline at (239) 031-2037. This is open 24 hours a day. Summary  Generalized anxiety disorder (GAD) is a mental health disorder that involves worry that is not triggered by a  specific event.  People with GAD often worry excessively about many things in their lives, such as their health and family.  GAD may cause physical symptoms such as restlessness, trouble concentrating, sleep problems, frequent sweating, nausea, diarrhea, headaches, and trembling or muscle twitching.  A mental health specialist can help determine which treatment is best for you. Some people see improvement with one type of therapy. However, other people require a combination of therapies. This information is not intended to replace advice given to you by your health care provider. Make sure you discuss any questions you have with your health care provider. Document Revised: 02/06/2017 Document Reviewed: 01/15/2016 Elsevier Patient Education  2020 [employer].

## 2019-06-14 NOTE — Progress Notes (Signed)
Subjective:    Patient ID: Renee Fleming, female    DOB: [birth year], 34 y.o.   MRN: [id number]  CC: Renee Fleming is a 34 y.o. female who presents today for physical exam.    HPI: Increased anxiety over the past year.  Describes that she knows that she is using food and alcohol for anxiety and to 'self medicate'.  Will have mixed drink of vodka until has a 'buzz.' Will drink mixed 4 drinks per night.  She does feel the needs to cut down on drinking alcohol.  People do not feel annoyed by alcohol use. She hasnt done anything dangerous; she doesn't drink alcohol when her husband is not at home and she is watching children ( he is a Insurance underwriter with frequent travel) or drink and drive. No drug use Vapes.    Denies feeling guilty for alcohol consumption or having an eye opener. Family h/o addiction.   Trouble sleeping as cannot turn mind off.   Masks worsen anxiety and feels anxious when goes to doctor's office as always worried something will be wrong. Feels anxious and describes her ears as hot and tingling in both her hands. She can feel her heart beating fast . denies cp, sob, HA, vision changes. She can call her husband and he calms her down.   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 Husband is working from home.  No depression. No si/hi   Colorectal Cancer Screening: no early family history Breast Cancer Screening: no early family history Cervical Cancer Screening: Due; on menses today and will return for PAP        Tetanus - utd         Labs:screening labs today. Exercise: No regular exercise.  Alcohol use: occasional Smoking/tobacco use: vape  Follows with dermatology for annual exam.  HISTORY:  Past Medical History:  Diagnosis Date  . Diastasis recti   . Gallbladder polyp 2018   resolved; not seen on RUQ [place] 2018; per brynett, no further imaging unless symptoms  . Pancreatitis   . Umbilical hernia     Past Surgical History:   Procedure Laterality Date  . VAGINAL DELIVERY     3x no complications  . WISDOM TOOTH EXTRACTION     Family History  Problem Relation Age of Onset  . Alcohol abuse Father   . Emotional abuse Father   . Cancer Paternal Grandmother        kidney      ALLERGIES: Neosporin [neomycin-bacitracin zn-polymyx]  No current outpatient medications on file prior to visit.   No current facility-administered medications on file prior to visit.    Social History   Tobacco Use  . Smoking status: Former Smoker    Years: 5.00    Quit date: 01/15/2008    Years since quitting: 11.4  . Smokeless tobacco: Never Used  Substance Use Topics  . Alcohol use: Yes    Alcohol/week: 2.0 standard drinks    Types: 2 Standard drinks or equivalent per week    Comment: 4 mixed drinks per night, vodka  . Drug use: No    Review of Systems  Constitutional: Negative for chills, fever and unexpected weight change.  HENT: Negative for congestion.   Eyes: Negative for visual disturbance.  Respiratory: Negative for cough and shortness of breath.   Cardiovascular: Positive for palpitations. Negative for chest pain and leg swelling.  Gastrointestinal: Negative for nausea and vomiting.  Musculoskeletal: Negative for arthralgias and myalgias.  Skin: Negative for rash.  Neurological: Negative for headaches.  Hematological: Negative for adenopathy.  Psychiatric/Behavioral: Positive for sleep disturbance. Negative for confusion and suicidal ideas. The patient is nervous/anxious.       Objective:    BP 118/88   Pulse (!) 109   Temp (!) 97 F (36.1 C) (Temporal)   Ht 5\' 3"  (1.6 m)   Wt 144 lb 12.8 oz (65.7 kg)   SpO2 98%   BMI 25.65 kg/m   BP Readings from Last 3 Encounters:  06/14/19 118/88  12/01/17 118/78  08/18/17 136/87   Wt Readings from Last 3 Encounters:  06/14/19 144 lb 12.8 oz (65.7 kg)  12/01/17 126 lb 8 oz (57.4 kg)  08/18/17 123 lb 4 oz (55.9 kg)    Physical Exam Vitals reviewed.    Constitutional:      Appearance: She is well-developed.  HENT:     Mouth/Throat:     Pharynx: Uvula midline.  Eyes:     Conjunctiva/sclera: Conjunctivae normal.     Pupils: Pupils are equal, round, and reactive to light.     Comments: Fundus normal bilaterally.   Neck:     Thyroid: No thyroid mass or thyromegaly.  Cardiovascular:     Rate and Rhythm: Normal rate and regular rhythm.     Pulses: Normal pulses.     Heart sounds: Normal heart sounds.  Pulmonary:     Effort: Pulmonary effort is normal.     Breath sounds: Normal breath sounds. No wheezing, rhonchi or rales.  Chest:     Breasts: Breasts are symmetrical.        Right: No inverted nipple, mass, nipple discharge, skin change or tenderness.        Left: No inverted nipple, mass, nipple discharge, skin change or tenderness.  Lymphadenopathy:     Head:     Right side of head: No submental, submandibular, tonsillar, preauricular, posterior auricular or occipital adenopathy.     Left side of head: No submental, submandibular, tonsillar, preauricular, posterior auricular or occipital adenopathy.     Cervical: No cervical adenopathy.     Right cervical: No superficial, deep or posterior cervical adenopathy.    Left cervical: No superficial, deep or posterior cervical adenopathy.  Skin:    General: Skin is warm and dry.  Neurological:     Mental Status: She is alert.     Cranial Nerves: No cranial nerve deficit.     Sensory: No sensory deficit.     Deep Tendon Reflexes:     Reflex Scores:      Bicep reflexes are 2+ on the right side and 2+ on the left side.      Patellar reflexes are 2+ on the right side and 2+ on the left side.    Comments: Grip equal and strong bilateral upper extremities. Gait strong and steady. Able to perform rapid alternating movement without difficulty.   Psychiatric:        Speech: Speech normal.        Behavior: Behavior normal.        Thought Content: Thought content normal.         Assessment & Plan:   Problem List Items Addressed This Visit      Other   Adjustment disorder with mixed anxiety and depressed mood    Worsened anxiety over past year. Start zoloft and education provided in regards to side effects. Close follow up      Relevant Medications   sertraline (ZOLOFT)  25 MG tablet   Other Relevant Orders   Ambulatory referral to Psychology   Alcohol abuse    Discussed uncontrolled anxiety and how it contributes to alcohol use. Referral to counseling with focus in regards to addiction. Start zoloft.       Palpitations    Long discussion as we suspect anxiety and likely an anxiety attack transpired after our conversation today. We spoke at length about concern for alcohol use. She had 4 mixed drinks with vodka last night, vaped on way to appointment today which she agreed likely contributory to elevated HR. She was able to deep breath in exam room, speak to her husband on the phone and was feeling somewhat better, HR came down to 109. EKG shows sinus tachycardia and no significant changes from prior in 2015. No acute ischemia.  Case and EKG reviewed with supervising, Dr Duncan Dull, and she and I jointly agreed on management plan to start zoloft and evaluate for electrolyte and metabolic abnormalities . Patient declines cardiology consult which I think is appropriate in the absence of CP, SOB. We will start zoloft with close follow up.          Routine physical examination - Primary    CBE and advised baseline mammogram ( she will call [employer] first and let me know). Deferred pap and she will return for this at follow up.        Other Visit Diagnoses    Palpitation       Relevant Orders   CBC with Differential/Platelet   TSH   Comprehensive metabolic panel   Magnesium       I have discontinued Mellonie C. Fitting's terconazole and cephALEXin. I am also having her start on sertraline.   Meds ordered this encounter  Medications  . sertraline (ZOLOFT) 25  MG tablet    Sig: Take 1 tablet (25 mg total) by mouth at bedtime.    Dispense:  90 tablet    Refill:  1    Order Specific Question:   Supervising Provider    Answer:   Sherlene Shams [[id number]]    Return precautions given.   Risks, benefits, and alternatives of the medications and treatment plan prescribed today were discussed, and patient expressed understanding.   Education regarding symptom management and diagnosis given to patient on AVS.   Continue to follow with Allegra Grana, FNP for routine health maintenance.   Izabela Crowder Ottley and I agreed with plan.   Rennie Plowman, FNP

## 2019-06-14 NOTE — Assessment & Plan Note (Addendum)
Long discussion as we suspect anxiety and likely an anxiety attack transpired after our conversation today. We spoke at length about concern for alcohol use. She had 4 mixed drinks with vodka last night, vaped on way to appointment today which she agreed likely contributory to elevated HR. She was able to deep breath in exam room, speak to her husband on the phone and was feeling somewhat better, HR came down to 109. EKG shows sinus tachycardia and no significant changes from prior in 2015. No acute ischemia.  Case and EKG reviewed with supervising, Dr Duncan Dull, and she and I jointly agreed on management plan to start zoloft and evaluate for electrolyte and metabolic abnormalities . Patient declines cardiology consult which I think is appropriate in the absence of CP, SOB. We will start zoloft with close follow up.

## 2019-06-14 NOTE — Assessment & Plan Note (Signed)
Worsened anxiety over past year. Start zoloft and education provided in regards to side effects. Close follow up

## 2019-06-14 NOTE — Assessment & Plan Note (Signed)
CBE and advised baseline mammogram ( she will call [employer] first and let me know). Deferred pap and she will return for this at follow up.

## 2019-06-14 NOTE — Assessment & Plan Note (Signed)
Discussed uncontrolled anxiety and how it contributes to alcohol use. Referral to counseling with focus in regards to addiction. Start zoloft.

## 2019-06-15 ENCOUNTER — Telehealth: Payer: Self-pay | Admitting: Family

## 2019-06-15 DIAGNOSIS — R748 Abnormal levels of other serum enzymes: Secondary | ICD-10-CM

## 2019-06-15 NOTE — Telephone Encounter (Signed)
[facility] counseling as well so it may make sense for her to call them first.  We can give her  [facility] phone number as well if she would like to call them in regards to counseling.    Renee Fleming, I placed ref for counseling. Do you know what the wait is for this and who you refer?

## 2019-06-15 NOTE — Telephone Encounter (Signed)
Patient stated that she does drink nightly to the point of getting a buzz. She does this to wind down she said. Sometimes she does not remember going to bed. She said that you guys has discussed her speaking with someone yesterday & that is something that she is open to. She did not drink last night & said that after her appointment yesterday she really wanted to try to stop. Her resting HR last night was 68.

## 2019-06-15 NOTE — Telephone Encounter (Signed)
Noted. Patient is calling us back as well to update [place] & let us know how all goes with [facility].

## 2019-06-15 NOTE — Telephone Encounter (Signed)
Noted  

## 2019-06-15 NOTE — Telephone Encounter (Signed)
Call pt How is she doing today?   Liver enzymes elevated, likely from alcohol use. Potassium slightly low as well.  Please confirm that she doesn't drink every night   if she does drink EVERY night than I would want to work with addiction specialist prior to telling her to stop completely.   If she goes days without drinking and has been lessening her alcohol, she may reduce her drinking amount.   She needs to have labs rechecked in on Thursday , please schedule.

## 2019-06-15 NOTE — Telephone Encounter (Signed)
Spoke with patient  Feels well today. No palpitations, CP. HR 68 Education provided on abrupt cessation of stopping alcohol if she is drinking daily and withdrawal. She may need librium taper and to be followed closed with rehab facility.   She clarified that she will drink every night. When husband is there, she will drink 4 mixed drinks.When he is not there, she will drink less, 2 mixed drinks.  When doesn't drink , no tremors. Doesn't feel that she 'craves alcohol.' In the past she has drank daily and has been able to stop in the past without a problem.  Decided that wanted to not drink alcohol yesterday and feels so much better. She doesn't want to drink anymore. States that when ready to stop alcohol, she can stop. Able to sleep which was encouraging last night.. NO withdrawal symptoms. No N,V, palpitations, tremors, confusion.   Would like to stop alcohol and start exercise and would hold on zoloft for a couple of weeks.    Sarah,  Please call [facility] and ask for recommendations for substance abuse rehab and counselors. Do they offer that inhouse?

## 2019-06-15 NOTE — Telephone Encounter (Signed)
I left message with pt I found the place I want her to call today please  [facility] , she needs to call [phone]. I want to ensure she safely decreases her alcohol use with support  We also have a separate ref for counseling.

## 2019-06-15 NOTE — Telephone Encounter (Signed)
LMTCB

## 2019-06-15 NOTE — Telephone Encounter (Signed)
I spoke with patient & gave her number to [facility]. Pt was going to call them & I was unsure by message if I needed to provide a separate reference for a counselor. Pt really wants to go ahead & get established with one. Did you have a specific place or one to refer her to?

## 2019-06-16 ENCOUNTER — Other Ambulatory Visit: Payer: Self-pay

## 2019-06-16 ENCOUNTER — Other Ambulatory Visit (INDEPENDENT_AMBULATORY_CARE_PROVIDER_SITE_OTHER): Payer: BC Managed Care – PPO

## 2019-06-16 DIAGNOSIS — R748 Abnormal levels of other serum enzymes: Secondary | ICD-10-CM | POA: Diagnosis not present

## 2019-06-16 LAB — COMPREHENSIVE METABOLIC PANEL
ALT: 80 U/L — ABNORMAL HIGH (ref 0–35)
AST: 82 U/L — ABNORMAL HIGH (ref 0–37)
Albumin: 4.9 g/dL (ref 3.5–5.2)
Alkaline Phosphatase: 63 U/L (ref 39–117)
BUN: 11 mg/dL (ref 6–23)
CO2: 25 mEq/L (ref 19–32)
Calcium: 9.7 mg/dL (ref 8.4–10.5)
Chloride: 96 mEq/L (ref 96–112)
Creatinine, Ser: 0.8 mg/dL (ref 0.40–1.20)
GFR: 81.32 mL/min (ref >60.00)
Glucose, Bld: 98 mg/dL (ref 70–99)
Potassium: 3.2 mEq/L — ABNORMAL LOW (ref 3.5–5.1)
Sodium: 137 mEq/L (ref 135–145)
Total Bilirubin: 0.5 mg/dL (ref 0.2–1.2)
Total Protein: 7.6 g/dL (ref 6.0–8.3)

## 2019-06-17 ENCOUNTER — Other Ambulatory Visit: Payer: Self-pay | Admitting: Family

## 2019-06-17 DIAGNOSIS — L700 Acne vulgaris: Secondary | ICD-10-CM | POA: Diagnosis not present

## 2019-06-17 DIAGNOSIS — D2262 Melanocytic nevi of left upper limb, including shoulder: Secondary | ICD-10-CM | POA: Diagnosis not present

## 2019-06-17 DIAGNOSIS — E876 Hypokalemia: Secondary | ICD-10-CM

## 2019-06-17 DIAGNOSIS — D2261 Melanocytic nevi of right upper limb, including shoulder: Secondary | ICD-10-CM | POA: Diagnosis not present

## 2019-06-17 DIAGNOSIS — D225 Melanocytic nevi of trunk: Secondary | ICD-10-CM | POA: Diagnosis not present

## 2019-06-17 MED ORDER — POTASSIUM CHLORIDE ER 10 MEQ PO TBCR: 10.0000 meq | EXTENDED_RELEASE_TABLET | Freq: Two times a day (BID) | ORAL | 0 refills | Status: DC

## 2019-06-20 NOTE — Telephone Encounter (Signed)
Good afternoon!  No referral was placed for counseling. I sent a referral to [facility] for psychology. Thank you!

## 2019-06-21 ENCOUNTER — Other Ambulatory Visit: Payer: Self-pay

## 2019-06-21 ENCOUNTER — Telehealth: Payer: Self-pay | Admitting: Family

## 2019-06-21 ENCOUNTER — Other Ambulatory Visit: Payer: Self-pay | Admitting: Family

## 2019-06-21 ENCOUNTER — Other Ambulatory Visit (INDEPENDENT_AMBULATORY_CARE_PROVIDER_SITE_OTHER): Payer: BC Managed Care – PPO

## 2019-06-21 DIAGNOSIS — E876 Hypokalemia: Secondary | ICD-10-CM

## 2019-06-21 LAB — COMPREHENSIVE METABOLIC PANEL
ALT: 53 U/L — ABNORMAL HIGH (ref 0–35)
AST: 45 U/L — ABNORMAL HIGH (ref 0–37)
Albumin: 4.4 g/dL (ref 3.5–5.2)
Alkaline Phosphatase: 59 U/L (ref 39–117)
BUN: 11 mg/dL (ref 6–23)
CO2: 27 mEq/L (ref 19–32)
Calcium: 9.4 mg/dL (ref 8.4–10.5)
Chloride: 101 mEq/L (ref 96–112)
Creatinine, Ser: 0.65 mg/dL (ref 0.40–1.20)
GFR: 103.33 mL/min (ref >60.00)
Glucose, Bld: 99 mg/dL (ref 70–99)
Potassium: 3.3 mEq/L — ABNORMAL LOW (ref 3.5–5.1)
Sodium: 138 mEq/L (ref 135–145)
Total Bilirubin: 0.4 mg/dL (ref 0.2–1.2)
Total Protein: 6.9 g/dL (ref 6.0–8.3)

## 2019-06-21 LAB — CBC WITH DIFFERENTIAL/PLATELET
Basophils Absolute: 0 10*3/uL (ref 0.0–0.1)
Basophils Relative: 0.5 % (ref 0.0–3.0)
Eosinophils Absolute: 0.1 10*3/uL (ref 0.0–0.7)
Eosinophils Relative: 1.9 % (ref 0.0–5.0)
HCT: 37.2 % (ref 36.0–46.0)
Hemoglobin: 12.9 g/dL (ref 12.0–15.0)
Lymphocytes Relative: 33.1 % (ref 12.0–46.0)
Lymphs Abs: 1.4 10*3/uL (ref 0.7–4.0)
MCHC: 34.6 g/dL (ref 30.0–36.0)
MCV: 94.4 fl (ref 78.0–100.0)
Monocytes Absolute: 0.7 10*3/uL (ref 0.1–1.0)
Monocytes Relative: 16.1 % — ABNORMAL HIGH (ref 3.0–12.0)
Neutro Abs: 2.1 10*3/uL (ref 1.4–7.7)
Neutrophils Relative %: 48.4 % (ref 43.0–77.0)
Platelets: 139 10*3/uL — ABNORMAL LOW (ref 150.0–400.0)
RBC: 3.94 Mil/uL (ref 3.87–5.11)
RDW: 12.9 % (ref 11.5–15.5)
WBC: 4.3 10*3/uL (ref 4.0–10.5)

## 2019-06-21 MED ORDER — POTASSIUM CHLORIDE ER 10 MEQ PO TBCR: 10.0000 meq | EXTENDED_RELEASE_TABLET | Freq: Two times a day (BID) | ORAL | 0 refills | Status: AC

## 2019-06-21 NOTE — Telephone Encounter (Signed)
Pt returned your call.  

## 2019-06-21 NOTE — Telephone Encounter (Signed)
Call pt Did she complete the potassium? Potassium hardly increased Please confirm NO diarrhea, vomiting, use of diuretics?   If not please refill prior dose from last week in which she takes Kcl BID x 3 days and sch repeat CMP. I have ordered. I know she is tired of labs, we just need to follow liver enzymes ( which are coming down) and of course potassium.   Otherwise , how is she doing? Has she started zoloft?

## 2019-06-21 NOTE — Telephone Encounter (Signed)
LMTCB

## 2019-06-21 NOTE — Telephone Encounter (Signed)
I spoke with patient & she has confirmed no diarrhea, vomiting or use of diuretics. She did complete potassium that was sent last week. I have sent per your request potassium BID for 2 days. Patient is scheduled for labs on Monday to recheck.  Patient also wanted to know your thoughts on if you felt that her being on the keto diet & not really eating any potassium rich foods could cause this? I told her I would run that by you for advice.

## 2019-06-21 NOTE — Telephone Encounter (Signed)
before you send in any potassium for ms Renee Fleming - please call me . i want to know how she is doing first I may send in only 1-2 days worth of medication

## 2019-06-22 ENCOUNTER — Other Ambulatory Visit: Payer: Self-pay | Admitting: Family

## 2019-06-22 DIAGNOSIS — R7989 Other specified abnormal findings of blood chemistry: Secondary | ICD-10-CM

## 2019-06-22 NOTE — Telephone Encounter (Signed)
close

## 2019-06-22 NOTE — Telephone Encounter (Signed)
So this means all good if you sent to [facility] psychology Do uou need something from me?   TY!

## 2019-06-27 ENCOUNTER — Other Ambulatory Visit (INDEPENDENT_AMBULATORY_CARE_PROVIDER_SITE_OTHER): Payer: BC Managed Care – PPO

## 2019-06-27 ENCOUNTER — Other Ambulatory Visit: Payer: Self-pay

## 2019-06-27 DIAGNOSIS — R7989 Other specified abnormal findings of blood chemistry: Secondary | ICD-10-CM

## 2019-06-27 DIAGNOSIS — E876 Hypokalemia: Secondary | ICD-10-CM

## 2019-06-27 NOTE — Telephone Encounter (Signed)
No ma'am 

## 2019-06-28 ENCOUNTER — Telehealth: Payer: Self-pay | Admitting: Family

## 2019-06-28 ENCOUNTER — Other Ambulatory Visit: Payer: Self-pay | Admitting: Family

## 2019-06-28 ENCOUNTER — Other Ambulatory Visit (INDEPENDENT_AMBULATORY_CARE_PROVIDER_SITE_OTHER): Payer: BC Managed Care – PPO

## 2019-06-28 ENCOUNTER — Encounter: Payer: Self-pay | Admitting: Family

## 2019-06-28 DIAGNOSIS — R7989 Other specified abnormal findings of blood chemistry: Secondary | ICD-10-CM

## 2019-06-28 LAB — LIPASE: Lipase: 89 U/L — ABNORMAL HIGH (ref 11.0–59.0)

## 2019-06-28 LAB — COMPREHENSIVE METABOLIC PANEL
ALT: 60 U/L — ABNORMAL HIGH (ref 0–35)
AST: 48 U/L — ABNORMAL HIGH (ref 0–37)
Albumin: 4.6 g/dL (ref 3.5–5.2)
Alkaline Phosphatase: 59 U/L (ref 39–117)
BUN: 7 mg/dL (ref 6–23)
CO2: 26 mEq/L (ref 19–32)
Calcium: 9.7 mg/dL (ref 8.4–10.5)
Chloride: 103 mEq/L (ref 96–112)
Creatinine, Ser: 0.63 mg/dL (ref 0.40–1.20)
GFR: 107.11 mL/min (ref >60.00)
Glucose, Bld: 94 mg/dL (ref 70–99)
Potassium: 3.9 mEq/L (ref 3.5–5.1)
Sodium: 139 mEq/L (ref 135–145)
Total Bilirubin: 0.4 mg/dL (ref 0.2–1.2)
Total Protein: 7.2 g/dL (ref 6.0–8.3)

## 2019-06-28 LAB — CBC WITH DIFFERENTIAL/PLATELET
Basophils Absolute: 0 10*3/uL (ref 0.0–0.1)
Basophils Relative: 0.8 % (ref 0.0–3.0)
Eosinophils Absolute: 0.1 10*3/uL (ref 0.0–0.7)
Eosinophils Relative: 1.4 % (ref 0.0–5.0)
HCT: 37.9 % (ref 36.0–46.0)
Hemoglobin: 12.9 g/dL (ref 12.0–15.0)
Lymphocytes Relative: 25.7 % (ref 12.0–46.0)
Lymphs Abs: 1.2 10*3/uL (ref 0.7–4.0)
MCHC: 34 g/dL (ref 30.0–36.0)
MCV: 95.2 fl (ref 78.0–100.0)
Monocytes Absolute: 0.5 10*3/uL (ref 0.1–1.0)
Monocytes Relative: 9.9 % (ref 3.0–12.0)
Neutro Abs: 2.9 10*3/uL (ref 1.4–7.7)
Neutrophils Relative %: 62.2 % (ref 43.0–77.0)
Platelets: 242 10*3/uL (ref 150.0–400.0)
RBC: 3.97 Mil/uL (ref 3.87–5.11)
RDW: 13.2 % (ref 11.5–15.5)
WBC: 4.7 10*3/uL (ref 4.0–10.5)

## 2019-06-28 LAB — AMYLASE: Amylase: 76 U/L (ref 27–131)

## 2019-06-28 NOTE — Telephone Encounter (Signed)
I left vm for pt to call ofc to sch US abdomen. 

## 2019-06-29 ENCOUNTER — Telehealth: Payer: Self-pay | Admitting: Family

## 2019-06-29 ENCOUNTER — Other Ambulatory Visit: Payer: Self-pay | Admitting: Family

## 2019-06-29 DIAGNOSIS — Z8719 Personal history of other diseases of the digestive system: Secondary | ICD-10-CM

## 2019-06-29 NOTE — Telephone Encounter (Signed)
Noted We discussed via mychart

## 2019-06-29 NOTE — Telephone Encounter (Signed)
Per referral ofc- Patient not ready to schedule until she talks to her pcp.

## 2019-06-29 NOTE — Progress Notes (Signed)
am

## 2019-07-04 ENCOUNTER — Other Ambulatory Visit: Payer: BC Managed Care – PPO

## 2019-07-06 ENCOUNTER — Telehealth: Payer: Self-pay | Admitting: Family

## 2019-07-06 NOTE — Telephone Encounter (Signed)
I left pt a vm to call ofc to sch US. 

## 2019-07-07 ENCOUNTER — Telehealth: Payer: Self-pay | Admitting: Family

## 2019-07-07 NOTE — Telephone Encounter (Signed)
I left vm to call ofc to sch [place].

## 2019-07-08 ENCOUNTER — Telehealth: Payer: Self-pay | Admitting: Family

## 2019-07-08 NOTE — Telephone Encounter (Signed)
Ok. Noted. Thanks.

## 2019-07-08 NOTE — Telephone Encounter (Signed)
Pt was called three time and was left three vm. In speaking with pt did she want to sch [place]? Please advise and Thank you!

## 2019-07-08 NOTE — Telephone Encounter (Signed)
Yes, she told me wanted to wait until our follow up visit 07/19/19

## 2019-07-08 NOTE — Telephone Encounter (Signed)
I left vm for pt to cal ofc to sch [place].

## 2019-07-15 ENCOUNTER — Telehealth: Payer: Self-pay | Admitting: Family

## 2019-07-15 NOTE — Telephone Encounter (Signed)
Left pt msg with the number to call and sch [place]

## 2019-07-19 ENCOUNTER — Ambulatory Visit: Payer: BC Managed Care – PPO | Admitting: Family

## 2019-08-22 ENCOUNTER — Encounter: Payer: Self-pay | Admitting: Family
